# Patient Record
Sex: Female | Born: 1995 | Race: Black or African American | Hispanic: No | Marital: Single | Smoking: Never smoker
Health system: Southern US, Community
[De-identification: ages and names within clinical notes are randomized; demographics above are authoritative.]

## PROBLEM LIST (undated history)

## (undated) ENCOUNTER — Inpatient Hospital Stay (HOSPITAL_COMMUNITY): Payer: Self-pay

## (undated) DIAGNOSIS — T7840XA Allergy, unspecified, initial encounter: Secondary | ICD-10-CM

## (undated) DIAGNOSIS — Z8619 Personal history of other infectious and parasitic diseases: Secondary | ICD-10-CM

## (undated) DIAGNOSIS — A6009 Herpesviral infection of other urogenital tract: Secondary | ICD-10-CM

## (undated) DIAGNOSIS — A549 Gonococcal infection, unspecified: Secondary | ICD-10-CM

## (undated) DIAGNOSIS — A749 Chlamydial infection, unspecified: Secondary | ICD-10-CM

## (undated) HISTORY — DX: Gonococcal infection, unspecified: A54.9

## (undated) HISTORY — DX: Herpesviral infection of other urogenital tract: A60.09

## (undated) HISTORY — PX: TONSILLECTOMY: SUR1361

## (undated) HISTORY — DX: Allergy, unspecified, initial encounter: T78.40XA

## (undated) HISTORY — DX: Chlamydial infection, unspecified: A74.9

---
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
## 2013-11-06 ENCOUNTER — Emergency Department (HOSPITAL_COMMUNITY)
Admission: EM | Admit: 2013-11-06 | Discharge: 2013-11-06 | Disposition: A | Discharge status: Home / Self Care | Payer: BC Managed Care – PPO | Attending: Emergency Medicine | Admitting: Emergency Medicine

## 2013-11-06 ENCOUNTER — Encounter (HOSPITAL_COMMUNITY): Payer: Self-pay | Admitting: Emergency Medicine

## 2013-11-06 DIAGNOSIS — L02215 Cutaneous abscess of perineum: Secondary | ICD-10-CM

## 2013-11-06 DIAGNOSIS — L02219 Cutaneous abscess of trunk, unspecified: Secondary | ICD-10-CM | POA: Insufficient documentation

## 2013-11-06 DIAGNOSIS — O9989 Other specified diseases and conditions complicating pregnancy, childbirth and the puerperium: Secondary | ICD-10-CM | POA: Insufficient documentation

## 2013-11-06 DIAGNOSIS — Z8619 Personal history of other infectious and parasitic diseases: Secondary | ICD-10-CM | POA: Insufficient documentation

## 2013-11-06 MED ORDER — CLINDAMYCIN HCL 150 MG PO CAPS: 300.0000 mg | ORAL_CAPSULE | Freq: Three times a day (TID) | ORAL | Status: DC

## 2013-11-06 NOTE — ED Notes (Addendum)
Pt reports noticing a bump to L post thigh, and for past 3 days noticed it getting bigger and now is an abscess; tried hot compress at home; pt 6 months pregnant

## 2013-11-06 NOTE — ED Provider Notes (Signed)
CSN: [id number]     Arrival date & time 11/06/13  0027 History   None    Chief Complaint  Patient presents with  . Abscess    HPI  Raven Tucker is a 17 y.o. female with a PMH of allergies, chlamydia, herpes, and gonorrhea who presents to the ED for evaluation of an abscess.  History was provided by the patient.  Patient states she has had a developing lump to her left inner upper thigh for the past 3 days.  She states that the mass is growing larger and becoming more painful.  She tried applying warm compresses with no relief.  She has a hx of a cyst to her right arm but no abscesses in the past.  She denies any prior wounds/trauma to the area.  She denies any hx of DM.  She denies any fevers, chills, change in appetite/activity, abdominal pain, nausea, emesis, vaginal bleeding/discharge, dysuria, or other concerns.  She is currently 6 months pregnant with no complications.     Past Medical History  Diagnosis Date  . Allergy   . Chlamydia 2012; Dec 2013    x 2  . 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 Gonorrhea 11/12/2012   Past Surgical History  Procedure Laterality Date  . Tonsillectomy     Family History  Problem Relation Age of Onset  . Asthma Mother   . Heart disease Paternal Grandfather    History  Substance Use Topics  . Smoking status: Never Smoker   . Smokeless tobacco: Not on file  . Alcohol Use: No   OB History   Grav Para Term Preterm Abortions TAB SAB Ect Mult Living   1              Review of Systems  Constitutional: Negative for fever, chills, diaphoresis, appetite change and fatigue.  HENT: Negative for sore throat.   Eyes: Negative for visual disturbance.  Respiratory: Negative for cough and shortness of breath.   Cardiovascular: Negative for chest pain and leg swelling.  Gastrointestinal: Negative for nausea, vomiting, abdominal pain and diarrhea.  Genitourinary: Negative for dysuria, decreased urine volume, vaginal bleeding,  vaginal discharge, genital sores, vaginal pain and pelvic pain.  Musculoskeletal: Negative for back pain, gait problem, myalgias and neck pain.  Skin: Positive for color change and wound.  Neurological: Negative for weakness and headaches.    Allergies  Review of patient's allergies indicates no known allergies.  Home Medications  No current outpatient prescriptions on file. BP 116/74  Pulse 92  Temp(Src) 98.3 F (36.8 C) (Oral)  Resp 20  SpO2 100%  LMP 10/02/2012  Filed Vitals:   11/06/13 0035  BP: 116/74  Pulse: 92  Temp: 98.3 F (36.8 C)  TempSrc: Oral  Resp: 20  SpO2: 100%    Physical Exam  Nursing note and vitals reviewed. Constitutional: She is oriented to person, place, and time. She appears well-developed and well-nourished. No distress.  HENT:  Head: Normocephalic and atraumatic.  Right Ear: External ear normal.  Left Ear: External ear normal.  Nose: Nose normal.  Eyes: Conjunctivae are normal. Right eye exhibits no discharge. Left eye exhibits no discharge.  Neck: Normal range of motion. Neck supple.  Cardiovascular: Normal rate, regular rhythm, normal heart sounds and intact distal pulses.  Exam reveals no gallop and no friction rub.   No murmur heard. Pulmonary/Chest: Effort normal and breath sounds normal. No respiratory distress. She has no wheezes. She has no rales. She exhibits no  tenderness.  Abdominal: Soft. Bowel sounds are normal. She exhibits no distension and no mass. There is no tenderness. There is no rebound and no guarding.  Protuberant pregnant non-tender abdomen  Genitourinary:     Musculoskeletal: Normal range of motion. She exhibits no edema and no tenderness.  Patient able to ambulate without difficulty or ataxia  Neurological: She is alert and oriented to person, place, and time.  Skin: Skin is warm and dry. She is not diaphoretic.  5 cm x 4 cm fluctuant abscess to the left perineal region. No evidence of drainage. 6 cm x 6 cm  underlying area of induration. No overlying or surrounding erythema. Area is tender to palpation.     ED Course  INCISION AND DRAINAGE Date/Time: 11/06/2013 4:30 AM Performed by: Coral Ceo K Authorized by: Jillyn Ledger Consent: Verbal consent obtained. Risks and benefits: risks, benefits and alternatives were discussed Consent given by: patient Patient identity confirmed: verbally with patient Type: abscess Body area: anogenital Location details: perineum Anesthesia: local infiltration Local anesthetic: lidocaine 2% without epinephrine Anesthetic total: 4 ml Patient sedated: no Scalpel size: 11 Incision type: single straight Complexity: simple Drainage: purulent Drainage amount: moderate Wound treatment: wound left open Packing material: wick placed Patient tolerance: Patient tolerated the procedure well with no immediate complications.   (including critical care time) Labs Review Labs Reviewed - No data to display Imaging Review No results found.  EKG Interpretation   None       MDM   Raven Tucker is a 17 y.o. female with a PMH of allergies, chlamydia, herpes, and gonorrhea who presents to the ED for evaluation of an abscess.  Rechecks  4:20 AM =  Fetal heart tones 160    Patient seen in the emergency department for an abscess to the left perineal region, which was drained in the emergency department. Patient is afebrile and nontoxic in appearance. A wick was placed. Patient was placed on antibiotics clindamycin. Consulted pharmacy who states that clindamycin is safe to use in pregnancy. Patient was instructed to return to emergency Department in 2 days for wick removal and wound recheck. Patient was given return precautions.     Discharge Medication List as of 11/06/2013  4:35 AM    START taking these medications   Details  clindamycin (CLEOCIN) 150 MG capsule Take 2 capsules (300 mg total) by mouth 3 (three) times daily. May dispense  as 150mg  capsules, Starting 11/06/2013, Until Discontinued, Print         Final impressions: 1. Perineal abscess       Luiz Iron PA-C   This patient was discussed with Dr. Julio Sicks, PA-C 11/06/13 2115

## 2013-11-07 ENCOUNTER — Ambulatory Visit (INDEPENDENT_AMBULATORY_CARE_PROVIDER_SITE_OTHER): Payer: BC Managed Care – PPO | Admitting: Family Medicine

## 2013-11-07 VITALS — BP 102/58 | HR 82 | Temp 97.9°F | Resp 16 | Ht 61.5 in | Wt 175.0 lb

## 2013-11-07 DIAGNOSIS — L03319 Cellulitis of trunk, unspecified: Secondary | ICD-10-CM

## 2013-11-07 DIAGNOSIS — Z3403 Encounter for supervision of normal first pregnancy, third trimester: Secondary | ICD-10-CM

## 2013-11-07 DIAGNOSIS — L02214 Cutaneous abscess of groin: Secondary | ICD-10-CM

## 2013-11-07 DIAGNOSIS — R52 Pain, unspecified: Secondary | ICD-10-CM

## 2013-11-07 DIAGNOSIS — L02219 Cutaneous abscess of trunk, unspecified: Secondary | ICD-10-CM

## 2013-11-07 NOTE — ED Provider Notes (Signed)
Medical screening examination/treatment/procedure(s) were performed by non-physician practitioner and as supervising physician I was immediately available for consultation/collaboration.    Vida Roller, MD 11/07/13 [phone]

## 2013-11-07 NOTE — Progress Notes (Signed)
Chief Complaint:  Chief Complaint  Patient presents with  . Wound Check    HPI: Raven Tucker is a 17 y.o. female who is here for a follow up on abscess from 2 days ago on left inner thigh. It is slowly getting better , she is able to walk better when it is covered up. Patient was seen at [facility] emergency room on 11/06/2013 where they drained the abscess and discharged her on Clindamycin. She is afebrile. From prior notes it does not appear that she had a wound culture done. Mom states she thought one was done.  She started her abx today at noon, so has only had 2 doses of the clindamycin. She is 6 months pregnant.  She has pain with sitting, and she feels leakage from the wound.   Past Medical History  Diagnosis Date  . Allergy   . Chlamydia 2012; Dec 2013    x 2  . 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 Gonorrhea 11/12/2012   Past Surgical History  Procedure Laterality Date  . Tonsillectomy     History   Social History  . Marital Status: Single    Spouse Name: N/A    Number of Children: N/A  . Years of Education: N/A   Social History Main Topics  . Smoking status: Never Smoker   . Smokeless tobacco: Not on file  . Alcohol Use: No  . Drug Use: No  . Sexual Activity: Not on file   Other Topics Concern  . Not on file   Social History Narrative  . No narrative on file   Family History  Problem Relation Age of Onset  . Asthma Mother   . Heart disease Paternal Grandfather    No Known Allergies Prior to Admission medications   Medication Sig Start Date End Date Taking? Authorizing Provider  clindamycin (CLEOCIN) 150 MG capsule Take 2 capsules (300 mg total) by mouth 3 (three) times daily. May dispense as 150mg  capsules 11/06/13  Yes Jillyn Ledger, PA-C     ROS: The patient denies fevers, chills, night sweats, unintentional weight loss, chest pain, palpitations, wheezing, dyspnea on exertion, nausea, vomiting, abdominal pain, dysuria,  hematuria, melena, numbness, weakness, or tingling.   All other systems have been reviewed and were otherwise negative with the exception of those mentioned in the HPI and as above.    PHYSICAL EXAM: Filed Vitals:   11/07/13 1947  BP: 102/58  Pulse: 82  Temp: 97.9 F (36.6 C)  Resp: 16   Filed Vitals:   11/07/13 1947  Height: 5' 1.5" (1.562 m)  Weight: 175 lb (79.379 kg)   Body mass index is 32.53 kg/(m^2).  General: Alert, no acute distress HEENT:  Normocephalic, atraumatic, oropharynx patent. EOMI, PERRLA Cardiovascular:  Regular rate and rhythm, no rubs murmurs or gallops.  No Carotid bruits, radial pulse intact. No pedal edema.  Respiratory: Clear to auscultation bilaterally.  No wheezes, rales, or rhonchi.  No cyanosis, no use of accessory musculature GI: No organomegaly, abdomen is gravid and non-tender, positive bowel sounds. FHM 140-150 Skin: + perineal left abscess, serosainguionous drainage, 3 incision sites , 1 closed, and 2 are closing, there is more fluctuance on the lower pole of abscess, erythematous, tender, warm Neurologic: Facial musculature symmetric. Psychiatric: Patient is appropriate throughout our interaction. Lymphatic: No cervical lymphadenopathy Musculoskeletal: Gait intact.   LABS: Results for orders placed in visit on 11/08/12  HCG, QUANTITATIVE, PREGNANCY      Result  Value Range   hCG, Beta Chain, Quant, S <2.0       EKG/XRAY:   Primary read interpreted by Dr. Conley Rolls at [facility].   ASSESSMENT/PLAN: Encounter Diagnosis  Name Primary?  Marland Kitchen Abscess of superficial perineal space Yes   17 y/o [place] American female G1P0 who is at [redacted] weeks gestation who is here with a superficial abscess of the left inner thigh: Had to do another reincision since prior I&D were closing and still had fluctuance underneath Cleaned periwound with normal saline, Packed  Packing strips after I and D Continue with Cleocin   + skin erythema , warmth, tenderness of wound  noted. Risks (including but not limited to bleeding and infection), benefits, and alternatives discussed for left inner thigh abscess incision and aspiration. Verbal consent obtained after any questions were answered., and verbal understanding expressed. Landmarks noted, and marked as needed. Area cleansed with Betadine, ethyl chloride spray for topical anesthesia, followed by alcohol swab. 25 gauge needle used for 2 % lidocaine without epi, once anesthesia of area obtained 11 inch blade was used to make incision slightly bigger than previous so that pus could drain more freely. Minimal blood nad  pus were released from larger incision. EBL less than 0.5 ml. Paitent tolerated procedure well. Wound culture was obtained since she had really just started on her abx FHM was in 140-150, Refer to Ob Gyn for pregnancy, encourage prenatal vitamins, she just found out she was pregnant , has not had OB US F/u in 2 days with  Mrs Rhoderick Moody PA-C for wound checl Gross sideeffects, risk and benefits, and alternatives of medications d/w patient. Patient is aware that all medications have potential sideeffects and we are unable to predict every sideeffect or drug-drug interaction that may occur.  Hamilton Capri PHUONG, DO 11/07/2013 9:25 PM

## 2013-11-10 ENCOUNTER — Encounter (HOSPITAL_COMMUNITY): Payer: Self-pay

## 2013-11-10 ENCOUNTER — Inpatient Hospital Stay (HOSPITAL_COMMUNITY)
Admission: AD | Admit: 2013-11-10 | Discharge: 2013-11-10 | Disposition: A | Discharge status: Home / Self Care | Payer: BC Managed Care – PPO | Source: Ambulatory Visit | Attending: Obstetrics and Gynecology | Admitting: Obstetrics and Gynecology

## 2013-11-10 ENCOUNTER — Ambulatory Visit (INDEPENDENT_AMBULATORY_CARE_PROVIDER_SITE_OTHER): Payer: BC Managed Care – PPO | Admitting: Family Medicine

## 2013-11-10 VITALS — BP 110/64 | HR 80 | Temp 97.9°F | Resp 16 | Ht 61.5 in | Wt 177.2 lb

## 2013-11-10 DIAGNOSIS — Z113 Encounter for screening for infections with a predominantly sexual mode of transmission: Secondary | ICD-10-CM

## 2013-11-10 DIAGNOSIS — L02419 Cutaneous abscess of limb, unspecified: Secondary | ICD-10-CM | POA: Insufficient documentation

## 2013-11-10 DIAGNOSIS — O0930 Supervision of pregnancy with insufficient antenatal care, unspecified trimester: Secondary | ICD-10-CM | POA: Insufficient documentation

## 2013-11-10 DIAGNOSIS — O26859 Spotting complicating pregnancy, unspecified trimester: Secondary | ICD-10-CM

## 2013-11-10 DIAGNOSIS — O98819 Other maternal infectious and parasitic diseases complicating pregnancy, unspecified trimester: Secondary | ICD-10-CM | POA: Insufficient documentation

## 2013-11-10 DIAGNOSIS — L02219 Cutaneous abscess of trunk, unspecified: Secondary | ICD-10-CM

## 2013-11-10 DIAGNOSIS — Z3403 Encounter for supervision of normal first pregnancy, third trimester: Secondary | ICD-10-CM

## 2013-11-10 DIAGNOSIS — A5901 Trichomonal vulvovaginitis: Secondary | ICD-10-CM | POA: Insufficient documentation

## 2013-11-10 DIAGNOSIS — N898 Other specified noninflammatory disorders of vagina: Secondary | ICD-10-CM | POA: Insufficient documentation

## 2013-11-10 DIAGNOSIS — A599 Trichomoniasis, unspecified: Secondary | ICD-10-CM

## 2013-11-10 DIAGNOSIS — L02214 Cutaneous abscess of groin: Secondary | ICD-10-CM

## 2013-11-10 LAB — URINALYSIS, ROUTINE W REFLEX MICROSCOPIC
Bilirubin Urine: NEGATIVE
Glucose, UA: NEGATIVE mg/dL
Ketones, ur: 15 mg/dL — AB
pH: 7.5 (ref 5.0–8.0)

## 2013-11-10 LAB — POCT WET PREP WITH KOH
Clue Cells Wet Prep HPF POC: NEGATIVE
KOH Prep POC: NEGATIVE
RBC Wet Prep HPF POC: NEGATIVE
Trichomonas, UA: POSITIVE
Yeast Wet Prep HPF POC: NEGATIVE

## 2013-11-10 LAB — URINE MICROSCOPIC-ADD ON

## 2013-11-10 LAB — WOUND CULTURE
Gram Stain: NONE SEEN
Gram Stain: NONE SEEN
Gram Stain: NONE SEEN

## 2013-11-10 LAB — ABO/RH: ABO/RH(D): B POS

## 2013-11-10 MED ORDER — METRONIDAZOLE 500 MG PO TABS
2000.0000 mg | ORAL_TABLET | Freq: Once | ORAL | Status: AC
  Administered 2013-11-10: 2000 mg via ORAL
  Filled 2013-11-10: qty 4

## 2013-11-10 NOTE — MAU Note (Signed)
Dr. Ike Bene at bedside with pt and her mother discussing discharge plans.

## 2013-11-10 NOTE — MAU Note (Signed)
Patient states she has had no prenatal care. Has had spotting on and off for the past two days, none today. States she had an abscess on the left inner thigh drained on 12-10 at [facility] ED and is on antibiotics. Denies abdominal pain. Reports good fetal movement.

## 2013-11-10 NOTE — MAU Provider Note (Signed)
`````  Attestation of Attending Supervision of Advanced Practitioner: Evaluation and management procedures were performed by the PA/NP/CNM/OB Fellow under my supervision/collaboration. Chart reviewed and agree with management and plan.  Yuan Gann V 11/10/2013 6:11 PM

## 2013-11-10 NOTE — Progress Notes (Signed)
   Subjective:    Patient ID: Raven Tucker, female    DOB: 11-30-1995, 17 y.o.   MRN: [id number]  Wound Check   17 year old female presents today for wound check. Had left groin abscess drained in ED on 11/06/13 and then followed up here on 11/07/13.  Incision was lengthened and more purulence expressed. Also was packed and bandaged with follow up planned today.  She admits wound does seem to be doing much better and less painful.  Has been on Clindamycin which she had been tolerating well until last night she noticed some mucous in her stool.    Patient is 6 months pregnant - found out 2 weeks ago. She has not had any prenatal care. Presents today with her mother who states they were planning to call tomorrow and make an appointment. Also concerned about a small amount of spotting she noticed last night. Denies any further bleeding today. Denies any abdominal pain, fever, chills, nausea, or vomiting.  Also has noticed a small amount of vaginal discharge.  No pruritis or odor.      Review of Systems  Constitutional: Negative for fever and chills.  Gastrointestinal: Negative for nausea and vomiting.  Genitourinary: Positive for vaginal bleeding and vaginal discharge. Negative for dysuria and vaginal pain.  Skin: Positive for wound.  Neurological: Negative for headaches.       Objective:   Physical Exam  Constitutional: She is oriented to person, place, and time. She appears well-developed and well-nourished.  HENT:  Head: Normocephalic and atraumatic.  Right Ear: External ear normal.  Left Ear: External ear normal.  Eyes: Conjunctivae are normal.  Neck: Normal range of motion.  Cardiovascular: Normal rate.   Pulmonary/Chest: Effort normal.  Genitourinary:    There is no rash on the right labia. There is no rash on the left labia. Vaginal discharge found.  Neurological: She is alert and oriented to person, place, and time.  Psychiatric: She has a normal mood and affect. Her  behavior is normal. Judgment and thought content normal.   Procedure: Dressing removed. Packing not in place Irrigated with 2% plain lidocaine.  Wound bed healthy No purulence expressed.  Repacked with 1/4 plain packing.  Dressing applied.    Results for orders placed in visit on 11/10/13  POCT WET PREP WITH KOH      Result Value Range   Trichomonas, UA Positive     Clue Cells Wet Prep HPF POC neg     Epithelial Wet Prep HPF POC 4-10     Yeast Wet Prep HPF POC neg     Bacteria Wet Prep HPF POC 2+     RBC Wet Prep HPF POC neg     WBC Wet Prep HPF POC 6-15     KOH Prep POC Negative         Assessment & Plan:  Trichomonas  Abscess, groin  Leukorrhea, not specified as infective - Plan: POCT Wet Prep with KOH  Screening for venereal disease - Plan: GC/Chlamydia Probe Amp, CANCELED: HIV antibody, CANCELED: RPR Patient is 6 months pregnant and has had no prenatal care up to this point. Now has trichomonas infection with spotting. Will send to [facility] for f/u. HIV/RPR cancelled here. Genprobe pending Abscess of left groin well healing.  Recommend continued dressing changes. Will let OB decide if continued therapy with clindamycin is needed.  Follow up here as needed.

## 2013-11-10 NOTE — Progress Notes (Signed)
History and physical examinations reviewed in detail with Rhoderick Moody, PA-C.  Agree with A/P.

## 2013-11-10 NOTE — MAU Provider Note (Signed)
History     CSN: [id number]  Arrival date and time: 11/10/13 1332   First Provider Initiated Contact with Patient 11/10/13 1428      Chief Complaint  Patient presents with  . Vaginal Discharge   HPI Raven Tucker is a 17 y.o. G1P0 at [redacted]w[redacted]d presents from Urgent care with multiple complaints.  1) pt reports vaginal spotting over the last 3 days. Pt had 2 episode with wiping after urination. Red, Minimal, not in the toilet. First was 2 days ago, once yesterday and none today. Pt states never has had any on her underwear. Never has had any in the toilet.   2) Vaginal discharge - pt was diagnosed with trich at Urgent care. Pt reports that she has been having vaginal discharge for several months.  3) Lack of prenatal care: pt is in need of establishing care. Pt is in [place] for the holiday, but is planning on returning to school in the spring for the remainder of her pregnancy. Pt needs to establish care in [place] and I will provide a list of providers in the area both here and in [place].   4) wound packing. Pt has an abscess that was drained 2 days ago. Pt presented to urgent care today for dressing change and was repacked. Pt currently on clindamycin for coverage. This is a category B medication.   Pt reports +FM, no lof, no additional VB, and no ctx.  OB History   Grav Para Term Preterm Abortions TAB SAB Ect Mult Living   1               Past Medical History  Diagnosis Date  . Allergy   . Chlamydia 2012; Dec 2013    x 2  . 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 Gonorrhea 11/12/2012    Past Surgical History  Procedure Laterality Date  . Tonsillectomy      Family History  Problem Relation Age of Onset  . Asthma Mother   . Heart disease Paternal Grandfather     History  Substance Use Topics  . Smoking status: Never Smoker   . Smokeless tobacco: Never Used  . Alcohol Use: No    Allergies: No Known Allergies  Prescriptions prior to admission   Medication Sig Dispense Refill  . clindamycin (CLEOCIN) 150 MG capsule Take 2 capsules (300 mg total) by mouth 3 (three) times daily. May dispense as 150mg  capsules  60 capsule  0    ROS Physical Exam   Blood pressure 110/64, pulse 76, temperature 97.9 F (36.6 C), temperature source Oral, resp. rate 16, height 5' (1.524 m), weight 80.377 kg (177 lb 3.2 oz), last menstrual period 04/01/2013, SpO2 100.00%.  Physical Exam VSS NAD Gravid NTTP No c/c/e SSE: inflamed cervix, frothy discharge. Appears closed.   Left leg is dressed freshly, repacked and was not reexamined today.  FHT:150s mod var, no accels or decels Toco: mild uterine irritability. Pt not feeling.  MAU Course  Procedures  MDM Gc/C, provide contacts for [place].  Assessment and Plan  Raven Tucker is a 17 y.o. G1P0 at [redacted]w[redacted]d with multiple complaints.  1) Vaginal spotting/Trich: likely related to cervical irrtation from trich. Minimal without any blood in vault. Will treat trich with flagyl 2000mg  x1 now, and recommend reevaluation if no resolution in next week. Will draw ABO to check Rh status.  2) Lack of prenatal care: pt is in need of establishing care. Pt is in [place] for the holiday, but is planning  on returning to school in the spring for the remainder of her pregnancy. Pt needs to establish care in [place] and I will provide a list of providers in the area both here and in [place].   4) wound packing. Pt has an abscess that was drained 2 days ago. Pt presented to urgent care today for dressing change and was repacked. Pt currently on clindamycin for coverage. This is a category B medication. Recommend pt continue abx coverage and to follow up with a PCM for continued wound management. Previously seen by [facility] when here for pediatrics.   FWB: no accels or decels. Overall reassuring fetal status. No acute changes at this time. PTL precautions reviewed.   Raven Tucker 11/10/2013, 2:51 PM

## 2013-11-11 ENCOUNTER — Encounter: Payer: Self-pay | Admitting: Obstetrics & Gynecology

## 2013-11-11 ENCOUNTER — Telehealth: Payer: Self-pay

## 2013-11-11 LAB — URINE CULTURE: Colony Count: 15000

## 2013-11-11 LAB — GC/CHLAMYDIA PROBE AMP
CT Probe RNA: POSITIVE — AB
GC Probe RNA: POSITIVE — AB

## 2013-11-11 NOTE — Telephone Encounter (Signed)
Pt. Called front stating she has GC/chlamydia and was told to call here and be treated and also schedule a new OB visit. Pt. States she can come in 11/13/13 at 830 am for treatment. Informed pt. I would schedule that with the front desk and that we will see her Wednesday.  Advised pt. To have partner treated and to abstain from sex until both are treated. Pt. Verbalized understanding and had no other questions or concerns.

## 2013-11-13 ENCOUNTER — Ambulatory Visit (INDEPENDENT_AMBULATORY_CARE_PROVIDER_SITE_OTHER): Payer: BC Managed Care – PPO

## 2013-11-13 ENCOUNTER — Telehealth: Payer: Self-pay | Admitting: Radiology

## 2013-11-13 VITALS — BP 132/80 | HR 77 | Wt 177.6 lb

## 2013-11-13 DIAGNOSIS — A749 Chlamydial infection, unspecified: Secondary | ICD-10-CM

## 2013-11-13 DIAGNOSIS — A5400 Gonococcal infection of lower genitourinary tract, unspecified: Secondary | ICD-10-CM

## 2013-11-13 DIAGNOSIS — A549 Gonococcal infection, unspecified: Secondary | ICD-10-CM

## 2013-11-13 MED ORDER — CEFTRIAXONE SODIUM 1 G IJ SOLR
250.0000 mg | Freq: Once | INTRAMUSCULAR | Status: AC
  Administered 2013-11-13: 250 mg via INTRAMUSCULAR

## 2013-11-13 MED ORDER — AZITHROMYCIN 250 MG PO TABS
1000.0000 mg | ORAL_TABLET | Freq: Once | ORAL | Status: AC
  Administered 2013-11-13: 1000 mg via ORAL

## 2013-11-13 NOTE — Telephone Encounter (Signed)
Error

## 2013-11-13 NOTE — Telephone Encounter (Signed)
Called patient for Raven Tucker, to see when she plans to return for her wound care. Patient does plan to come in tomorrow.

## 2013-11-14 ENCOUNTER — Ambulatory Visit (INDEPENDENT_AMBULATORY_CARE_PROVIDER_SITE_OTHER): Payer: BC Managed Care – PPO | Admitting: Physician Assistant

## 2013-11-14 VITALS — BP 110/66 | HR 81 | Temp 97.6°F | Resp 16 | Ht 61.0 in | Wt 176.6 lb

## 2013-11-14 DIAGNOSIS — L02214 Cutaneous abscess of groin: Secondary | ICD-10-CM

## 2013-11-14 DIAGNOSIS — Z113 Encounter for screening for infections with a predominantly sexual mode of transmission: Secondary | ICD-10-CM

## 2013-11-14 DIAGNOSIS — O0933 Supervision of pregnancy with insufficient antenatal care, third trimester: Secondary | ICD-10-CM

## 2013-11-14 DIAGNOSIS — Z3402 Encounter for supervision of normal first pregnancy, second trimester: Secondary | ICD-10-CM

## 2013-11-14 LAB — HIV ANTIBODY (ROUTINE TESTING W REFLEX): HIV: NONREACTIVE

## 2013-11-14 LAB — RPR

## 2013-11-14 NOTE — Progress Notes (Deleted)
   Subjective:    Patient ID: Raven Tucker, female    DOB: 1996/04/25, 17 y.o.   MRN: [id number]  HPI 17 year old female presents for evaluation of     Review of Systems     Objective:   Physical Exam        Assessment & Plan:

## 2013-11-14 NOTE — Progress Notes (Signed)
Patient ID: Raven Tucker MRN: [id number], DOB: May 08, 1996 17 y.o. Date of Encounter: 11/14/2013, 8:58 PM  Chief Complaint: Wound care   See previous note  HPI: 17 y.o. y/o female presents for wound care s/p I&D on 11/06/13.  Doing well No issues or complaints Afebrile/ no chills No nausea or vomiting Tolerating Clindamycin.   Pain resolved.  Daily dressing change  Also discussed visit to [facility]. Will draw HIV/RPR today. Plan to f/u on 11/26/13 for 1st OB visit. She does plan to return to [facility] the first week of January to finish school and will try to seek OB care there.   Previous notes reviewed  Past Medical History  Diagnosis Date  . Allergy   . Chlamydia 2012; Dec 2013    x 2  . 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 Gonorrhea 11/12/2012     Home Meds: Prior to Admission medications   Medication Sig Start Date End Date Taking? Authorizing Provider  clindamycin (CLEOCIN) 150 MG capsule Take 2 capsules (300 mg total) by mouth 3 (three) times daily. May dispense as 150mg  capsules 11/06/13  Yes Jillyn Ledger, PA-C  Prenatal Vit-Fe Fumarate-FA (PRENATAL MULTIVITAMIN) TABS tablet Take 1 tablet by mouth daily at 12 noon.   Yes Historical Provider, MD    Allergies: No Known Allergies  ROS: Constitutional: Afebrile, no chills Cardiovascular: negative for chest pain or palpitations Dermatological: Positive for wound. Negative for erythema, pain, or warmth.   GI: No nausea or vomiting   EXAM: Physical Exam: Blood pressure 110/66, pulse 81, temperature 97.6 F (36.4 C), temperature source Oral, resp. rate 16, height 5\' 1"  (1.549 m), weight 176 lb 9.6 oz (80.105 kg), last menstrual period 04/01/2013, SpO2 100.00%., Body mass index is 33.39 kg/(m^2). General: Well developed, well nourished, in no acute distress. Nontoxic appearing. Head: Normocephalic, atraumatic, sclera non-icteric.  Neck: Supple. Lungs: Breathing is unlabored. Heart: Normal  rate. Skin:  Warm and moist. Dressing and packing in place. No induration, erythema, or tenderness to palpation. Neuro: Alert and oriented X 3. Moves all extremities spontaneously. Normal gait.  Psych:  Responds to questions appropriately with a normal affect.       PROCEDURE: Dressing and packing removed. No purulence expressed Wound bed healthy Irrigated with 1% plain lidocaine 5 cc. Not repacked.  Dressing applied  LAB: Culture: multiple organisms, none predominant.   A/P: 17 y.o. y/o female with groin cellulitis/abscess as above s/p I&D on 11/06/13.  Wound care per above Continue Clindamycin.  Pain well controlled Daily dressing changes Recheck as needed.   Dr. Jaynie Collins remotely placed order for [place] in this encounter.     Grier Mitts, PA-C 11/14/2013 8:58 PM

## 2013-11-14 NOTE — Telephone Encounter (Signed)
Patient came in today. Herbert Seta has discussed patient with the clinic at [facility] and patient will be contacted with an appointment for an [place] soon.

## 2013-11-15 ENCOUNTER — Telehealth: Payer: Self-pay | Admitting: Radiology

## 2013-11-15 NOTE — Telephone Encounter (Signed)
Patient calling, wants to know when her [place] will be scheduled.

## 2013-11-18 NOTE — Telephone Encounter (Signed)
Ultrasound ordered for patient on 11/22/13 at 11 am.  Patient called and told about ultrasound date and time.  She was also reminded of her new OB appointment on 11/26/13 at 1:45pm.

## 2013-11-22 ENCOUNTER — Ambulatory Visit (HOSPITAL_COMMUNITY): Admission: RE | Admit: 2013-11-22 | Payer: BC Managed Care – PPO | Source: Ambulatory Visit

## 2013-11-26 ENCOUNTER — Ambulatory Visit (INDEPENDENT_AMBULATORY_CARE_PROVIDER_SITE_OTHER): Payer: BC Managed Care – PPO | Admitting: Family Medicine

## 2013-11-26 ENCOUNTER — Encounter: Payer: Self-pay | Admitting: Family Medicine

## 2013-11-26 VITALS — BP 114/82 | Wt 178.3 lb

## 2013-11-26 DIAGNOSIS — O0930 Supervision of pregnancy with insufficient antenatal care, unspecified trimester: Secondary | ICD-10-CM

## 2013-11-26 DIAGNOSIS — O0933 Supervision of pregnancy with insufficient antenatal care, third trimester: Secondary | ICD-10-CM

## 2013-11-26 DIAGNOSIS — Z23 Encounter for immunization: Secondary | ICD-10-CM

## 2013-11-26 DIAGNOSIS — A568 Sexually transmitted chlamydial infection of other sites: Secondary | ICD-10-CM

## 2013-11-26 DIAGNOSIS — A5901 Trichomonal vulvovaginitis: Secondary | ICD-10-CM

## 2013-11-26 DIAGNOSIS — O2390 Unspecified genitourinary tract infection in pregnancy, unspecified trimester: Secondary | ICD-10-CM

## 2013-11-26 DIAGNOSIS — O98213 Gonorrhea complicating pregnancy, third trimester: Secondary | ICD-10-CM

## 2013-11-26 DIAGNOSIS — O98319 Other infections with a predominantly sexual mode of transmission complicating pregnancy, unspecified trimester: Secondary | ICD-10-CM

## 2013-11-26 DIAGNOSIS — O98219 Gonorrhea complicating pregnancy, unspecified trimester: Secondary | ICD-10-CM

## 2013-11-26 DIAGNOSIS — A749 Chlamydial infection, unspecified: Secondary | ICD-10-CM

## 2013-11-26 LAB — POCT URINALYSIS DIP (DEVICE)
Bilirubin Urine: NEGATIVE
Ketones, ur: NEGATIVE mg/dL
Protein, ur: NEGATIVE mg/dL
Specific Gravity, Urine: 1.015 (ref 1.005–1.030)
Urobilinogen, UA: 0.2 mg/dL (ref 0.0–1.0)
pH: 7.5 (ref 5.0–8.0)

## 2013-11-26 LAB — CBC
MCV: 84 fL (ref 78.0–100.0)
Platelets: 182 10*3/uL (ref 150–400)
RBC: 4.37 MIL/uL (ref 3.87–5.11)
RDW: 12.8 % (ref 11.5–15.5)
WBC: 12.1 10*3/uL — ABNORMAL HIGH (ref 4.0–10.5)

## 2013-11-26 MED ORDER — TETANUS-DIPHTH-ACELL PERTUSSIS 5-2.5-18.5 LF-MCG/0.5 IM SUSP: 0.5000 mL | Freq: Once | INTRAMUSCULAR | Status: DC

## 2013-11-26 NOTE — Progress Notes (Signed)
  Subjective:    Raven Tucker is being seen today for her first obstetrical visit.  This is not a planned pregnancy. She is at [redacted]w[redacted]d gestation. Her obstetrical history is significant for multiple STI's (trich, gc/c). Relationship with FOB: seperated. Patient does not intend to breast feed. Pregnancy history fully reviewed.  Patient reports fatigue.  Review of Systems:   Review of Systems  Objective:     BP 114/82  Wt 80.876 kg (178 lb 4.8 oz)  LMP 04/01/2013 Physical Exam  Exam VSS NAD RRR no mgt CTAB no wrc Gravid NTTP, ND No c/c/e   Assessment:    Pregnancy: G1P0 Patient Active Problem List   Diagnosis Date Noted  . Insufficient prenatal care in third trimester 11/26/2013  . Gonorrhea 11/12/2012  . Chlamydia 11/12/2012  . Herpes simplex of female genitalia 11/12/2012       Plan:    +FM no lof no vb, no ctx  Raven Tucker is a 17 y.o. G1P0 at [redacted]w[redacted]d  here for ROB visit.  TOC for GC/C and trich today  Discussed with Patient:  -Plans to bottle feed.  All questions answered. -Continue prenatal vitamins. - Reviewed fetal kick counts (Pt to perform daily at a time when the baby is active, lie laterally with both hands on belly in quiet room and count all movements (hiccups, shoulder rolls, obvious kicks, etc); pt is to report to clinic or MAU for less than 10 movements felt in a one hour time period-pt told as soon as she counts 10 movements the count is complete.)  - Routine precautions discussed (depression, infection s/s).   Patient provided with all pertinent phone numbers for emergencies. - RTC for any VB, regular, painful cramps/ctxs occurring at a rate of >2/10 min, fever (100.5 or higher), n/v/d, any pain that is unresolving or worsening, LOF, decreased fetal movement, CP, SOB, edema  F/u appt 2 weeks  Problems: Patient Active Problem List   Diagnosis Date Noted  . Gonorrhea 11/12/2012  . Chlamydia 11/12/2012  . Herpes simplex of female genitalia  11/12/2012    To Do: 1.  Prenatal labs 2.  glucola 3. TOC gc/c wet mount  [ ]  Vaccines: Flu:  Tdap: 11/26/13 [ ]  BCM: undecided  Edu: [x ] PTL precautions; [ ]  BF class; [ ]  childbirth class; [ ]   BF counseling;

## 2013-11-26 NOTE — Patient Instructions (Signed)
Second Trimester of Pregnancy The second trimester is from week 13 through week 28, months 4 through 6. The second trimester is often a time when you feel your best. Your body has also adjusted to being pregnant, and you begin to feel better physically. Usually, morning sickness has lessened or quit completely, you may have more energy, and you may have an increase in appetite. The second trimester is also a time when the fetus is growing rapidly. At the end of the sixth month, the fetus is about 9 inches long and weighs about 1 pounds. You will likely begin to feel the baby move (quickening) between 18 and 20 weeks of the pregnancy. BODY CHANGES Your body goes through many changes during pregnancy. The changes vary from woman to woman.   Your weight will continue to increase. You will notice your lower abdomen bulging out.  You may begin to get stretch marks on your hips, abdomen, and breasts.  You may develop headaches that can be relieved by medicines approved by your caregiver.  You may urinate more often because the fetus is pressing on your bladder.  You may develop or continue to have heartburn as a result of your pregnancy.  You may develop constipation because certain hormones are causing the muscles that push waste through your intestines to slow down.  You may develop hemorrhoids or swollen, bulging veins (varicose veins).  You may have back pain because of the weight gain and pregnancy hormones relaxing your joints between the bones in your pelvis and as a result of a shift in weight and the muscles that support your balance.  Your breasts will continue to grow and be tender.  Your gums may bleed and may be sensitive to brushing and flossing.  Dark spots or blotches (chloasma, mask of pregnancy) may develop on your face. This will likely fade after the baby is born.  A dark line from your belly button to the pubic area (linea nigra) may appear. This will likely fade after the  baby is born. WHAT TO EXPECT AT YOUR PRENATAL VISITS During a routine prenatal visit:  You will be weighed to make sure you and the fetus are growing normally.  Your blood pressure will be taken.  Your abdomen will be measured to track your baby's growth.  The fetal heartbeat will be listened to.  Any test results from the previous visit will be discussed. Your caregiver may ask you:  How you are feeling.  If you are feeling the baby move.  If you have had any abnormal symptoms, such as leaking fluid, bleeding, severe headaches, or abdominal cramping.  If you have any questions. Other tests that may be performed during your second trimester include:  Blood tests that check for:  Low iron levels (anemia).  Gestational diabetes (between 24 and 28 weeks).  Rh antibodies.  Urine tests to check for infections, diabetes, or protein in the urine.  An ultrasound to confirm the proper growth and development of the baby.  An amniocentesis to check for possible genetic problems.  Fetal screens for spina bifida and Down syndrome. HOME CARE INSTRUCTIONS   Avoid all smoking, herbs, alcohol, and unprescribed drugs. These chemicals affect the formation and growth of the baby.  Follow your caregiver's instructions regarding medicine use. There are medicines that are either safe or unsafe to take during pregnancy.  Exercise only as directed by your caregiver. Experiencing uterine cramps is a good sign to stop exercising.  Continue to eat regular,   healthy meals.  Wear a good support bra for breast tenderness.  Do not use hot tubs, steam rooms, or saunas.  Wear your seat belt at all times when driving.  Avoid raw meat, uncooked cheese, cat litter boxes, and soil used by cats. These carry germs that can cause birth defects in the baby.  Take your prenatal vitamins.  Try taking a stool softener (if your caregiver approves) if you develop constipation. Eat more high-fiber foods,  such as fresh vegetables or fruit and whole grains. Drink plenty of fluids to keep your urine clear or pale yellow.  Take warm sitz baths to soothe any pain or discomfort caused by hemorrhoids. Use hemorrhoid cream if your caregiver approves.  If you develop varicose veins, wear support hose. Elevate your feet for 15 minutes, 3 4 times a day. Limit salt in your diet.  Avoid heavy lifting, wear low heel shoes, and practice good posture.  Rest with your legs elevated if you have leg cramps or low back pain.  Visit your dentist if you have not gone yet during your pregnancy. Use a soft toothbrush to brush your teeth and be gentle when you floss.  A sexual relationship may be continued unless your caregiver directs you otherwise.  Continue to go to all your prenatal visits as directed by your caregiver. SEEK MEDICAL CARE IF:   You have dizziness.  You have mild pelvic cramps, pelvic pressure, or nagging pain in the abdominal area.  You have persistent nausea, vomiting, or diarrhea.  You have a bad smelling vaginal discharge.  You have pain with urination. SEEK IMMEDIATE MEDICAL CARE IF:   You have a fever.  You are leaking fluid from your vagina.  You have spotting or bleeding from your vagina.  You have severe abdominal cramping or pain.  You have rapid weight gain or loss.  You have shortness of breath with chest pain.  You notice sudden or extreme swelling of your face, hands, ankles, feet, or legs.  You have not felt your baby move in over an hour.  You have severe headaches that do not go away with medicine.  You have vision changes. Document Released: 11/08/2001 Document Revised: 07/17/2013 Document Reviewed: 01/15/2013 [employer] Patient Information 2014 [employer].  

## 2013-11-26 NOTE — Progress Notes (Signed)
P= 81 Discussed appropriate weight gain based on BMI for this pregnancy (11-20lb). Pt. Verbalized understanding.  NEW OB packet given.  Tdap today  1hr gtt today.

## 2013-11-27 ENCOUNTER — Telehealth: Payer: Self-pay | Admitting: Family Medicine

## 2013-11-27 LAB — PRESCRIPTION MONITORING PROFILE (19 PANEL)
Amphetamine/Meth: NEGATIVE ng/mL
Barbiturate Screen, Urine: NEGATIVE ng/mL
Benzodiazepine Screen, Urine: NEGATIVE ng/mL
Buprenorphine, Urine: NEGATIVE ng/mL
Cannabinoid Scrn, Ur: NEGATIVE ng/mL
Carisoprodol, Urine: NEGATIVE ng/mL
Meperidine, Ur: NEGATIVE ng/mL
Methadone Screen, Urine: NEGATIVE ng/mL
Methaqualone: NEGATIVE ng/mL
Nitrites, Initial: NEGATIVE ug/mL
Opiate Screen, Urine: NEGATIVE ng/mL
Phencyclidine, Ur: NEGATIVE ng/mL
Propoxyphene: NEGATIVE ng/mL
Tapentadol, urine: NEGATIVE ng/mL

## 2013-11-27 LAB — GLUCOSE TOLERANCE, 1 HOUR (50G) W/O FASTING: Glucose, 1 Hour GTT: 72 mg/dL (ref 70–140)

## 2013-11-27 LAB — WET PREP, GENITAL

## 2013-11-27 LAB — RUBELLA SCREEN: Rubella: 3.07 Index — ABNORMAL HIGH (ref <0.90)

## 2013-11-27 LAB — HEPATITIS B SURFACE ANTIGEN: Hepatitis B Surface Ag: NEGATIVE

## 2013-11-27 LAB — GC/CHLAMYDIA PROBE AMP
CT Probe RNA: NEGATIVE
GC Probe RNA: NEGATIVE

## 2013-11-27 MED ORDER — FLUCONAZOLE 150 MG PO TABS: 150.0000 mg | ORAL_TABLET | Freq: Once | ORAL | Status: DC

## 2013-11-27 MED ORDER — METRONIDAZOLE 500 MG PO TABS: 2000.0000 mg | ORAL_TABLET | Freq: Once | ORAL | Status: AC

## 2013-11-27 MED ORDER — METRONIDAZOLE 500 MG PO TABS: 2000.0000 mg | ORAL_TABLET | Freq: Once | ORAL | Status: DC

## 2013-11-27 NOTE — Telephone Encounter (Signed)
Called prescriptions in to pharmacy as they both printed.  Called pt. And left message stating we are calling with results and that we have sent two prescriptions to her [facility] and that she can call clinic for more information.

## 2013-11-27 NOTE — Telephone Encounter (Signed)
Pt with + trich and yeast. Will treat trich with flagyl 2gx1 and diflucan 150mg x1 Please call and inform Pending GC/C 

## 2013-11-27 NOTE — Telephone Encounter (Signed)
Pt with + trich and yeast. Will treat trich with flagyl 2gx1 and diflucan 150mg  x1 Please call and inform Pending GC/C

## 2013-11-29 ENCOUNTER — Ambulatory Visit (HOSPITAL_COMMUNITY)
Admission: RE | Admit: 2013-11-29 | Discharge: 2013-11-29 | Disposition: A | Discharge status: Home / Self Care | Payer: BC Managed Care – PPO | Source: Ambulatory Visit | Attending: Obstetrics & Gynecology | Admitting: Obstetrics & Gynecology

## 2013-11-29 ENCOUNTER — Other Ambulatory Visit: Payer: Self-pay | Admitting: Obstetrics & Gynecology

## 2013-11-29 DIAGNOSIS — O26879 Cervical shortening, unspecified trimester: Secondary | ICD-10-CM | POA: Insufficient documentation

## 2013-11-29 DIAGNOSIS — O26873 Cervical shortening, third trimester: Secondary | ICD-10-CM

## 2013-11-29 DIAGNOSIS — O0933 Supervision of pregnancy with insufficient antenatal care, third trimester: Secondary | ICD-10-CM

## 2013-11-29 DIAGNOSIS — O358XX0 Maternal care for other (suspected) fetal abnormality and damage, not applicable or unspecified: Secondary | ICD-10-CM | POA: Insufficient documentation

## 2013-11-29 DIAGNOSIS — Z363 Encounter for antenatal screening for malformations: Secondary | ICD-10-CM | POA: Insufficient documentation

## 2013-11-29 DIAGNOSIS — A749 Chlamydial infection, unspecified: Secondary | ICD-10-CM

## 2013-11-29 DIAGNOSIS — B009 Herpesviral infection, unspecified: Secondary | ICD-10-CM

## 2013-11-29 DIAGNOSIS — Z1389 Encounter for screening for other disorder: Secondary | ICD-10-CM | POA: Insufficient documentation

## 2013-11-29 DIAGNOSIS — O98812 Other maternal infectious and parasitic diseases complicating pregnancy, second trimester: Secondary | ICD-10-CM

## 2013-11-29 DIAGNOSIS — O98519 Other viral diseases complicating pregnancy, unspecified trimester: Secondary | ICD-10-CM

## 2013-11-29 DIAGNOSIS — O98212 Gonorrhea complicating pregnancy, second trimester: Secondary | ICD-10-CM

## 2013-11-29 DIAGNOSIS — O0930 Supervision of pregnancy with insufficient antenatal care, unspecified trimester: Secondary | ICD-10-CM | POA: Insufficient documentation

## 2013-11-29 LAB — HEMOGLOBINOPATHY EVALUATION
Hemoglobin Other: 0 %
Hgb A2 Quant: 2.9 % (ref 2.2–3.2)
Hgb A: 96.3 % — ABNORMAL LOW (ref 96.8–97.8)
Hgb F Quant: 0.8 % (ref 0.0–2.0)
Hgb S Quant: 0 %

## 2013-12-02 ENCOUNTER — Encounter (HOSPITAL_COMMUNITY): Payer: Self-pay

## 2013-12-02 DIAGNOSIS — O26873 Cervical shortening, third trimester: Secondary | ICD-10-CM | POA: Insufficient documentation

## 2013-12-02 MED ORDER — PROGESTERONE MICRONIZED 200 MG PO CAPS: ORAL_CAPSULE | ORAL | Status: DC

## 2013-12-03 ENCOUNTER — Telehealth: Payer: Self-pay | Admitting: *Deleted

## 2013-12-03 NOTE — Telephone Encounter (Addendum)
Message copied by Jill SideAY, DIANE L on Tue Dec 03, 2013  9:13 AM ------      Message from: Jaynie CollinsANYANWU, UGONNA A      Created: Mon Dec 02, 2013  7:44 AM       Short cervx seen on ultrasound.  Progesterone prescribed until 36 weeks.  Please call to inform patient of results and advise her to pick up prescription.        ------ Called pt and informed her of US findings of short cervix requiring medication treatment. I advised her to stop having intercourse and not to place anything in the vagina except for prescribed medication.  Pt voiced understanding of all information and instructions given. She will keep next clinic appt on 12/13/13 @ 0830.

## 2013-12-10 ENCOUNTER — Telehealth: Payer: Self-pay | Admitting: *Deleted

## 2013-12-10 DIAGNOSIS — O26873 Cervical shortening, third trimester: Secondary | ICD-10-CM

## 2013-12-10 MED ORDER — PROGESTERONE MICRONIZED 200 MG PO CAPS: ORAL_CAPSULE | ORAL | Status: DC

## 2013-12-10 NOTE — Telephone Encounter (Signed)
Pt called nurse line requesting to speak with Dr. Ike Benedom concerning medications, his recommendations for treatment, and to schedule her appts according to her school schedule.

## 2013-12-10 NOTE — Telephone Encounter (Signed)
Called pt and pt stated that she went to go pick up her Rx for [facility]rium and [facility] that [facility] had two other prescriptions and did not know what they were for cause no one tried to call her.  I informed pt that it looks like the Rx that were sent to her pharmacy were for tx of trich and a yeast infection.  Pt stated that she already got treated.  Looking at pt chart it indicates that she was treated for trich and then a TOC was completed on 11/26/13 in which resulted in her testing + again for trich and yeast.  I informed pt of this info.  Pt stated understanding.  Pt then asked if she could have a different Rx instead of the prometrium cause it was too expensive per Dr. Jolayne Pantheronstant there is not another Rx that can be prescribed. [facility] which [facility] may receive a cheaper cost on the medication.  I also emphasized the importance of the medication and that she can consult with the provider at her next visit. Pt stated "ok".

## 2013-12-13 ENCOUNTER — Ambulatory Visit (INDEPENDENT_AMBULATORY_CARE_PROVIDER_SITE_OTHER): Payer: BC Managed Care – PPO | Admitting: Family Medicine

## 2013-12-13 VITALS — BP 107/68 | Temp 97.2°F | Wt 181.4 lb

## 2013-12-13 DIAGNOSIS — O26873 Cervical shortening, third trimester: Secondary | ICD-10-CM

## 2013-12-13 DIAGNOSIS — O26879 Cervical shortening, unspecified trimester: Secondary | ICD-10-CM

## 2013-12-13 LAB — POCT URINALYSIS DIP (DEVICE)
Bilirubin Urine: NEGATIVE
GLUCOSE, UA: NEGATIVE mg/dL
Ketones, ur: NEGATIVE mg/dL
NITRITE: NEGATIVE
Protein, ur: NEGATIVE mg/dL
Specific Gravity, Urine: 1.015 (ref 1.005–1.030)
UROBILINOGEN UA: 0.2 mg/dL (ref 0.0–1.0)
pH: 7 (ref 5.0–8.0)

## 2013-12-13 NOTE — Progress Notes (Signed)
Pulse- 78 Pt has not taken tx for trich as that she has been at school.  Pt states will pick today.

## 2013-12-13 NOTE — Patient Instructions (Signed)
Third Trimester of Pregnancy  The third trimester is from week 29 through week 42, months 7 through 9. The third trimester is a time when the fetus is growing rapidly. At the end of the ninth month, the fetus is about 20 inches in length and weighs 6 10 pounds.   BODY CHANGES  Your body goes through many changes during pregnancy. The changes vary from woman to woman.    Your weight will continue to increase. You can expect to gain 25 35 pounds (11 16 kg) by the end of the pregnancy.   You may begin to get stretch marks on your hips, abdomen, and breasts.   You may urinate more often because the fetus is moving lower into your pelvis and pressing on your bladder.   You may develop or continue to have heartburn as a result of your pregnancy.   You may develop constipation because certain hormones are causing the muscles that push waste through your intestines to slow down.   You may develop hemorrhoids or swollen, bulging veins (varicose veins).   You may have pelvic pain because of the weight gain and pregnancy hormones relaxing your joints between the bones in your pelvis. Back aches may result from over exertion of the muscles supporting your posture.   Your breasts will continue to grow and be tender. A yellow discharge may leak from your breasts called colostrum.   Your belly button may stick out.   You may feel short of breath because of your expanding uterus.   You may notice the fetus "dropping," or moving lower in your abdomen.   You may have a bloody mucus discharge. This usually occurs a few days to a week before labor begins.   Your cervix becomes thin and soft (effaced) near your due date.  WHAT TO EXPECT AT YOUR PRENATAL EXAMS   You will have prenatal exams every 2 weeks until week 36. Then, you will have weekly prenatal exams. During a routine prenatal visit:   You will be weighed to make sure you and the fetus are growing normally.   Your blood pressure is taken.   Your abdomen will be  measured to track your baby's growth.   The fetal heartbeat will be listened to.   Any test results from the previous visit will be discussed.   You may have a cervical check near your due date to see if you have effaced.  At around 36 weeks, your caregiver will check your cervix. At the same time, your caregiver will also perform a test on the secretions of the vaginal tissue. This test is to determine if a type of bacteria, Group B streptococcus, is present. Your caregiver will explain this further.  Your caregiver may ask you:   What your birth plan is.   How you are feeling.   If you are feeling the baby move.   If you have had any abnormal symptoms, such as leaking fluid, bleeding, severe headaches, or abdominal cramping.   If you have any questions.  Other tests or screenings that may be performed during your third trimester include:   Blood tests that check for low iron levels (anemia).   Fetal testing to check the health, activity level, and growth of the fetus. Testing is done if you have certain medical conditions or if there are problems during the pregnancy.  FALSE LABOR  You may feel small, irregular contractions that eventually go away. These are called Braxton Hicks contractions, or   false labor. Contractions may last for hours, days, or even weeks before true labor sets in. If contractions come at regular intervals, intensify, or become painful, it is best to be seen by your caregiver.   SIGNS OF LABOR    Menstrual-like cramps.   Contractions that are 5 minutes apart or less.   Contractions that start on the top of the uterus and spread down to the lower abdomen and back.   A sense of increased pelvic pressure or back pain.   A watery or bloody mucus discharge that comes from the vagina.  If you have any of these signs before the 37th week of pregnancy, call your caregiver right away. You need to go to the hospital to get checked immediately.  HOME CARE INSTRUCTIONS    Avoid all  smoking, herbs, alcohol, and unprescribed drugs. These chemicals affect the formation and growth of the baby.   Follow your caregiver's instructions regarding medicine use. There are medicines that are either safe or unsafe to take during pregnancy.   Exercise only as directed by your caregiver. Experiencing uterine cramps is a good sign to stop exercising.   Continue to eat regular, healthy meals.   Wear a good support bra for breast tenderness.   Do not use hot tubs, steam rooms, or saunas.   Wear your seat belt at all times when driving.   Avoid raw meat, uncooked cheese, cat litter boxes, and soil used by cats. These carry germs that can cause birth defects in the baby.   Take your prenatal vitamins.   Try taking a stool softener (if your caregiver approves) if you develop constipation. Eat more high-fiber foods, such as fresh vegetables or fruit and whole grains. Drink plenty of fluids to keep your urine clear or pale yellow.   Take warm sitz baths to soothe any pain or discomfort caused by hemorrhoids. Use hemorrhoid cream if your caregiver approves.   If you develop varicose veins, wear support hose. Elevate your feet for 15 minutes, 3 4 times a day. Limit salt in your diet.   Avoid heavy lifting, wear low heal shoes, and practice good posture.   Rest a lot with your legs elevated if you have leg cramps or low back pain.   Visit your dentist if you have not gone during your pregnancy. Use a soft toothbrush to brush your teeth and be gentle when you floss.   A sexual relationship may be continued unless your caregiver directs you otherwise.   Do not travel far distances unless it is absolutely necessary and only with the approval of your caregiver.   Take prenatal classes to understand, practice, and ask questions about the labor and delivery.   Make a trial run to the hospital.   Pack your hospital bag.   Prepare the baby's nursery.   Continue to go to all your prenatal visits as directed  by your caregiver.  SEEK MEDICAL CARE IF:   You are unsure if you are in labor or if your water has broken.   You have dizziness.   You have mild pelvic cramps, pelvic pressure, or nagging pain in your abdominal area.   You have persistent nausea, vomiting, or diarrhea.   You have a bad smelling vaginal discharge.   You have pain with urination.  SEEK IMMEDIATE MEDICAL CARE IF:    You have a fever.   You are leaking fluid from your vagina.   You have spotting or bleeding from your vagina.     You have severe abdominal cramping or pain.   You have rapid weight loss or gain.   You have shortness of breath with chest pain.   You notice sudden or extreme swelling of your face, hands, ankles, feet, or legs.   You have not felt your baby move in over an hour.   You have severe headaches that do not go away with medicine.   You have vision changes.  Document Released: 11/08/2001 Document Revised: 07/17/2013 Document Reviewed: 01/15/2013  [employer] Patient Information 2014 [employer].

## 2013-12-13 NOTE — Progress Notes (Signed)
S: 18 yo G1 @ 31 weeks.    - pt is now on prometrium PV for shortened cervix incidentally noted at 29 weeks.  - hasn't taken treatement for trich. To pick up today.  - no ctx, lof, vb. +FM.   O: see flowsheet  A/P - doing well - no need to f/u cervical length given GA.  - measuring normally  - cont to f/u every 2 weeks - will need TOC  - PTL precautions discussed.

## 2013-12-27 ENCOUNTER — Encounter: Payer: Self-pay | Admitting: Family Medicine

## 2013-12-27 ENCOUNTER — Ambulatory Visit (INDEPENDENT_AMBULATORY_CARE_PROVIDER_SITE_OTHER): Payer: BC Managed Care – PPO | Admitting: Family Medicine

## 2013-12-27 VITALS — BP 107/75 | Temp 97.0°F | Wt 182.6 lb

## 2013-12-27 DIAGNOSIS — O0933 Supervision of pregnancy with insufficient antenatal care, third trimester: Secondary | ICD-10-CM

## 2013-12-27 DIAGNOSIS — O0930 Supervision of pregnancy with insufficient antenatal care, unspecified trimester: Secondary | ICD-10-CM

## 2013-12-27 LAB — POCT URINALYSIS DIP (DEVICE)
BILIRUBIN URINE: NEGATIVE
Glucose, UA: NEGATIVE mg/dL
Hgb urine dipstick: NEGATIVE
Ketones, ur: NEGATIVE mg/dL
Nitrite: NEGATIVE
PH: 6.5 (ref 5.0–8.0)
PROTEIN: NEGATIVE mg/dL
Specific Gravity, Urine: >=1.03 (ref 1.005–1.030)
Urobilinogen, UA: 0.2 mg/dL (ref 0.0–1.0)

## 2013-12-27 MED ORDER — VALACYCLOVIR HCL 500 MG PO TABS: 500.0000 mg | ORAL_TABLET | Freq: Two times a day (BID) | ORAL | Status: AC

## 2013-12-27 NOTE — Patient Instructions (Signed)
Third Trimester of Pregnancy  The third trimester is from week 29 through week 42, months 7 through 9. The third trimester is a time when the fetus is growing rapidly. At the end of the ninth month, the fetus is about 20 inches in length and weighs 6 10 pounds.   BODY CHANGES  Your body goes through many changes during pregnancy. The changes vary from woman to woman.    Your weight will continue to increase. You can expect to gain 25 35 pounds (11 16 kg) by the end of the pregnancy.   You may begin to get stretch marks on your hips, abdomen, and breasts.   You may urinate more often because the fetus is moving lower into your pelvis and pressing on your bladder.   You may develop or continue to have heartburn as a result of your pregnancy.   You may develop constipation because certain hormones are causing the muscles that push waste through your intestines to slow down.   You may develop hemorrhoids or swollen, bulging veins (varicose veins).   You may have pelvic pain because of the weight gain and pregnancy hormones relaxing your joints between the bones in your pelvis. Back aches may result from over exertion of the muscles supporting your posture.   Your breasts will continue to grow and be tender. A yellow discharge may leak from your breasts called colostrum.   Your belly button may stick out.   You may feel short of breath because of your expanding uterus.   You may notice the fetus "dropping," or moving lower in your abdomen.   You may have a bloody mucus discharge. This usually occurs a few days to a week before labor begins.   Your cervix becomes thin and soft (effaced) near your due date.  WHAT TO EXPECT AT YOUR PRENATAL EXAMS   You will have prenatal exams every 2 weeks until week 36. Then, you will have weekly prenatal exams. During a routine prenatal visit:   You will be weighed to make sure you and the fetus are growing normally.   Your blood pressure is taken.   Your abdomen will be  measured to track your baby's growth.   The fetal heartbeat will be listened to.   Any test results from the previous visit will be discussed.   You may have a cervical check near your due date to see if you have effaced.  At around 36 weeks, your caregiver will check your cervix. At the same time, your caregiver will also perform a test on the secretions of the vaginal tissue. This test is to determine if a type of bacteria, Group B streptococcus, is present. Your caregiver will explain this further.  Your caregiver may ask you:   What your birth plan is.   How you are feeling.   If you are feeling the baby move.   If you have had any abnormal symptoms, such as leaking fluid, bleeding, severe headaches, or abdominal cramping.   If you have any questions.  Other tests or screenings that may be performed during your third trimester include:   Blood tests that check for low iron levels (anemia).   Fetal testing to check the health, activity level, and growth of the fetus. Testing is done if you have certain medical conditions or if there are problems during the pregnancy.  FALSE LABOR  You may feel small, irregular contractions that eventually go away. These are called Braxton Hicks contractions, or   false labor. Contractions may last for hours, days, or even weeks before true labor sets in. If contractions come at regular intervals, intensify, or become painful, it is best to be seen by your caregiver.   SIGNS OF LABOR    Menstrual-like cramps.   Contractions that are 5 minutes apart or less.   Contractions that start on the top of the uterus and spread down to the lower abdomen and back.   A sense of increased pelvic pressure or back pain.   A watery or bloody mucus discharge that comes from the vagina.  If you have any of these signs before the 37th week of pregnancy, call your caregiver right away. You need to go to the hospital to get checked immediately.  HOME CARE INSTRUCTIONS    Avoid all  smoking, herbs, alcohol, and unprescribed drugs. These chemicals affect the formation and growth of the baby.   Follow your caregiver's instructions regarding medicine use. There are medicines that are either safe or unsafe to take during pregnancy.   Exercise only as directed by your caregiver. Experiencing uterine cramps is a good sign to stop exercising.   Continue to eat regular, healthy meals.   Wear a good support bra for breast tenderness.   Do not use hot tubs, steam rooms, or saunas.   Wear your seat belt at all times when driving.   Avoid raw meat, uncooked cheese, cat litter boxes, and soil used by cats. These carry germs that can cause birth defects in the baby.   Take your prenatal vitamins.   Try taking a stool softener (if your caregiver approves) if you develop constipation. Eat more high-fiber foods, such as fresh vegetables or fruit and whole grains. Drink plenty of fluids to keep your urine clear or pale yellow.   Take warm sitz baths to soothe any pain or discomfort caused by hemorrhoids. Use hemorrhoid cream if your caregiver approves.   If you develop varicose veins, wear support hose. Elevate your feet for 15 minutes, 3 4 times a day. Limit salt in your diet.   Avoid heavy lifting, wear low heal shoes, and practice good posture.   Rest a lot with your legs elevated if you have leg cramps or low back pain.   Visit your dentist if you have not gone during your pregnancy. Use a soft toothbrush to brush your teeth and be gentle when you floss.   A sexual relationship may be continued unless your caregiver directs you otherwise.   Do not travel far distances unless it is absolutely necessary and only with the approval of your caregiver.   Take prenatal classes to understand, practice, and ask questions about the labor and delivery.   Make a trial run to the hospital.   Pack your hospital bag.   Prepare the baby's nursery.   Continue to go to all your prenatal visits as directed  by your caregiver.  SEEK MEDICAL CARE IF:   You are unsure if you are in labor or if your water has broken.   You have dizziness.   You have mild pelvic cramps, pelvic pressure, or nagging pain in your abdominal area.   You have persistent nausea, vomiting, or diarrhea.   You have a bad smelling vaginal discharge.   You have pain with urination.  SEEK IMMEDIATE MEDICAL CARE IF:    You have a fever.   You are leaking fluid from your vagina.   You have spotting or bleeding from your vagina.     You have severe abdominal cramping or pain.   You have rapid weight loss or gain.   You have shortness of breath with chest pain.   You notice sudden or extreme swelling of your face, hands, ankles, feet, or legs.   You have not felt your baby move in over an hour.   You have severe headaches that do not go away with medicine.   You have vision changes.  Document Released: 11/08/2001 Document Revised: 07/17/2013 Document Reviewed: 01/15/2013  [employer] Patient Information 2014 [employer].

## 2013-12-27 NOTE — Addendum Note (Signed)
Addended by: Minta BalsamDOM, Atlas Crossland R on: 12/27/2013 09:14 AM   Modules accepted: Orders

## 2013-12-27 NOTE — Progress Notes (Signed)
+  FM, no lof, no vb , no ctx  Start valtrex at 35wks  Raven Tucker is a 18 y.o. G1P0 at 10552w0d here for ROB visit.  Discussed with Patient:  -Plans to breast feed.  All questions answered. -Continue prenatal vitamins. -Reviewed fetal kick counts Pt to perform daily at a time when the baby is active, lie laterally with both hands on belly in quiet room and count all movements (hiccups, shoulder rolls, obvious kicks, etc); pt is to report to clinic L&D for less than 10 movements felt in a one hour time period-pt told as soon as she counts 10 movements the count is complete.  - Routine precautions discussed (depression, infection s/s).   Patient provided with all pertinent phone numbers for emergencies. - RTC for any VB, regular, painful cramps/ctxs occurring at a rate of >2/10 min, fever (100.5 or higher), n/v/d, any pain that is unresolving or worsening, LOF, decreased fetal movement, CP, SOB, edema - RTC in 2 weeks for next appt. - Did GBS swabs today and will f/u results and call if abnormal.  Contact#:  Pt has no drug allergies, so will get PCN if GBS+ OR will get sensitivities on GBS swab as pt is PCN-allergic.  Problems: Patient Active Problem List   Diagnosis Date Noted  . Short cervix in third trimester, antepartum 12/02/2013  . Insufficient prenatal care in third trimester 11/26/2013  . Gonorrhea complicating pregnancy in second trimester 11/12/2012  . Chlamydia infection complicating pregnancy in second trimester 11/12/2012  . 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 Tdap will be given today  [ ]  Vaccines: Flu: declines  Tdap: 11/26/2013 [ ]  BCM: mirena [ ]  Readiness: baby has a place to sleep, car seat, other baby necessities.  Edu: [x ] PTL precautions; [ ]  BF class; [ ]  childbirth class; [ ]   BF counseling;

## 2013-12-27 NOTE — Progress Notes (Signed)
Pulse- 74 Patient reports occasional pelvic pressure with walking

## 2013-12-30 ENCOUNTER — Ambulatory Visit (HOSPITAL_COMMUNITY): Payer: BC Managed Care – PPO

## 2014-01-03 ENCOUNTER — Encounter: Payer: Self-pay | Admitting: Family Medicine

## 2014-01-03 ENCOUNTER — Ambulatory Visit (HOSPITAL_COMMUNITY)
Admission: RE | Admit: 2014-01-03 | Discharge: 2014-01-03 | Disposition: A | Discharge status: Home / Self Care | Payer: BC Managed Care – PPO | Source: Ambulatory Visit | Attending: Family Medicine | Admitting: Family Medicine

## 2014-01-03 DIAGNOSIS — O3660X0 Maternal care for excessive fetal growth, unspecified trimester, not applicable or unspecified: Secondary | ICD-10-CM | POA: Insufficient documentation

## 2014-01-03 DIAGNOSIS — O26879 Cervical shortening, unspecified trimester: Secondary | ICD-10-CM | POA: Insufficient documentation

## 2014-01-03 DIAGNOSIS — O0933 Supervision of pregnancy with insufficient antenatal care, third trimester: Secondary | ICD-10-CM

## 2014-01-03 DIAGNOSIS — O0930 Supervision of pregnancy with insufficient antenatal care, unspecified trimester: Secondary | ICD-10-CM | POA: Insufficient documentation

## 2014-01-10 ENCOUNTER — Ambulatory Visit (INDEPENDENT_AMBULATORY_CARE_PROVIDER_SITE_OTHER): Payer: BC Managed Care – PPO | Admitting: Obstetrics & Gynecology

## 2014-01-10 ENCOUNTER — Encounter: Payer: Self-pay | Admitting: Obstetrics & Gynecology

## 2014-01-10 VITALS — BP 108/83 | Temp 97.9°F | Wt 186.9 lb

## 2014-01-10 DIAGNOSIS — O0930 Supervision of pregnancy with insufficient antenatal care, unspecified trimester: Secondary | ICD-10-CM

## 2014-01-10 DIAGNOSIS — O0933 Supervision of pregnancy with insufficient antenatal care, third trimester: Secondary | ICD-10-CM

## 2014-01-10 DIAGNOSIS — O98519 Other viral diseases complicating pregnancy, unspecified trimester: Secondary | ICD-10-CM

## 2014-01-10 DIAGNOSIS — B009 Herpesviral infection, unspecified: Secondary | ICD-10-CM

## 2014-01-10 LAB — POCT URINALYSIS DIP (DEVICE)
Bilirubin Urine: NEGATIVE
Glucose, UA: NEGATIVE mg/dL
Hgb urine dipstick: NEGATIVE
KETONES UR: NEGATIVE mg/dL
NITRITE: NEGATIVE
PROTEIN: NEGATIVE mg/dL
Specific Gravity, Urine: 1.015 (ref 1.005–1.030)
UROBILINOGEN UA: 0.2 mg/dL (ref 0.0–1.0)
pH: 7 (ref 5.0–8.0)

## 2014-01-10 MED ORDER — VALACYCLOVIR HCL 1 G PO TABS: 1000.0000 mg | ORAL_TABLET | Freq: Every day | ORAL | Status: DC

## 2014-01-10 NOTE — Patient Instructions (Signed)
Preterm Labor Information Preterm labor is when labor starts at less than 37 weeks of pregnancy. The normal length of a pregnancy is 39 to 41 weeks. CAUSES Often, there is no identifiable underlying cause as to why a woman goes into preterm labor. One of the most common known causes of preterm labor is infection. Infections of the uterus, cervix, vagina, amniotic sac, bladder, kidney, or even the lungs (pneumonia) can cause labor to start. Other suspected causes of preterm labor include:   Urogenital infections, such as yeast infections and bacterial vaginosis.   Uterine abnormalities (uterine shape, uterine septum, fibroids, or bleeding from the placenta).   A cervix that has been operated on (it may fail to stay closed).   Malformations in the fetus.   Multiple gestations (twins, triplets, and so on).   Breakage of the amniotic sac.  RISK FACTORS  Having a previous history of preterm labor.   Having premature rupture of membranes (PROM).   Having a placenta that covers the opening of the cervix (placenta previa).   Having a placenta that separates from the uterus (placental abruption).   Having a cervix that is too weak to hold the fetus in the uterus (incompetent cervix).   Having too much fluid in the amniotic sac (polyhydramnios).   Taking illegal drugs or smoking while pregnant.   Not gaining enough weight while pregnant.   Being younger than 18 and older than 20 years old.   Having a low socioeconomic status.   Being African American. SYMPTOMS Signs and symptoms of preterm labor include:   Menstrual-like cramps, abdominal pain, or back pain.  Uterine contractions that are regular, as frequent as six in an hour, regardless of their intensity (may be mild or painful).  Contractions that start on the top of the uterus and spread down to the lower abdomen and back.   A sense of increased pelvic pressure.   A watery or bloody mucus discharge that  comes from the vagina.  TREATMENT Depending on the length of the pregnancy and other circumstances, your health care provider may suggest bed rest. If necessary, there are medicines that can be given to stop contractions and to mature the fetal lungs. If labor happens before 34 weeks of pregnancy, a prolonged hospital stay may be recommended. Treatment depends on the condition of both you and the fetus.  WHAT SHOULD YOU DO IF YOU THINK YOU ARE IN PRETERM LABOR? Call your health care provider right away. You will need to go to the hospital to get checked immediately. HOW CAN YOU PREVENT PRETERM LABOR IN FUTURE PREGNANCIES? You should:   Stop smoking if you smoke.  Maintain healthy weight gain and avoid chemicals and drugs that are not necessary.  Be watchful for any type of infection.  Inform your health care provider if you have a known history of preterm labor. Document Released: 02/04/2004 Document Revised: 07/17/2013 Document Reviewed: 12/17/2012 [employer] Patient Information 2014 [employer].    

## 2014-01-10 NOTE — Progress Notes (Signed)
P-78 

## 2014-01-10 NOTE — Progress Notes (Signed)
No problems. Start HSV suppression today, Rx Valtrex

## 2014-01-24 ENCOUNTER — Encounter: Payer: BC Managed Care – PPO | Admitting: Obstetrics and Gynecology

## 2014-01-28 ENCOUNTER — Telehealth: Payer: Self-pay | Admitting: General Practice

## 2014-01-28 ENCOUNTER — Encounter: Payer: Self-pay | Admitting: Family Medicine

## 2014-01-28 NOTE — Telephone Encounter (Signed)
Patient called and left message stating she really needs to speak with Dr Ike Benedom about taking time off school she is available today between 9-11 and after 5pm. Called patient, no answer- left message that we are  Trying to return your phone call, please call us back at the clinics. Note: patient currently does not have an appt scheduled

## 2014-01-29 ENCOUNTER — Encounter: Payer: Self-pay | Admitting: Family Medicine

## 2014-01-30 NOTE — Telephone Encounter (Signed)
Spoke with pt via Mychart.

## 2014-02-05 ENCOUNTER — Encounter: Payer: Self-pay | Admitting: Family Medicine

## 2014-02-05 ENCOUNTER — Ambulatory Visit (INDEPENDENT_AMBULATORY_CARE_PROVIDER_SITE_OTHER): Payer: BC Managed Care – PPO | Admitting: Family Medicine

## 2014-02-05 ENCOUNTER — Encounter: Payer: Self-pay | Admitting: Advanced Practice Midwife

## 2014-02-05 VITALS — BP 129/94 | Temp 98.5°F | Wt 197.7 lb

## 2014-02-05 DIAGNOSIS — O26873 Cervical shortening, third trimester: Secondary | ICD-10-CM

## 2014-02-05 DIAGNOSIS — O26879 Cervical shortening, unspecified trimester: Secondary | ICD-10-CM

## 2014-02-05 DIAGNOSIS — O0930 Supervision of pregnancy with insufficient antenatal care, unspecified trimester: Secondary | ICD-10-CM

## 2014-02-05 DIAGNOSIS — O0933 Supervision of pregnancy with insufficient antenatal care, third trimester: Secondary | ICD-10-CM

## 2014-02-05 LAB — POCT URINALYSIS DIP (DEVICE)
Bilirubin Urine: NEGATIVE
GLUCOSE, UA: NEGATIVE mg/dL
Hgb urine dipstick: NEGATIVE
Ketones, ur: NEGATIVE mg/dL
NITRITE: NEGATIVE
Protein, ur: NEGATIVE mg/dL
Specific Gravity, Urine: 1.01 (ref 1.005–1.030)
UROBILINOGEN UA: 0.2 mg/dL (ref 0.0–1.0)
pH: 7 (ref 5.0–8.0)

## 2014-02-05 NOTE — Progress Notes (Signed)
+  FM, no lof, no vb, intermittent ctx  Raven Tucker is a 18 y.o. G1P0 at 5439w5d here for ROB visit.    Discussed with Patient:  - Plans to breast/bottle feed.  All questions answered. - Continue prenatal vitamins. - Reviewed fetal kick counts Pt to perform daily at a time when the baby is active, lie laterally with both hands on belly in quiet room and count all movements (hiccups, shoulder rolls, obvious kicks, etc); pt is to report to clinic MAU for less than 10 movements felt in a 2 hour time period-pt told as soon as she counts 10 movements the count is complete.  - Routine precautions discussed (depression, infection s/s).   Patient provided with all pertinent phone numbers for emergencies. - RTC for any VB, regular, painful cramps/ctxs occurring at a rate of >2/10 min, fever (100.5 or higher), n/v/d, any pain that is unresolving or worsening, LOF, decreased fetal movement, CP, SOB, edema -RTC in one week for next visit.  Problems: Patient Active Problem List   Diagnosis Date Noted  . Short cervix in third trimester, antepartum 12/02/2013  . Insufficient prenatal care in third trimester 11/26/2013  . Gonorrhea complicating pregnancy in second trimester 11/12/2012  . Chlamydia infection complicating pregnancy in second trimester 11/12/2012  . 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   [ ]  Vaccines: recd [ ]  BCM: mirena [ ]  Readiness: baby has a place to sleep, car seat, other baby necessities.  Edu: [ x] TL precautions; [ ]  BF class; [ ]  childbirth class; [ ]   BF counseling;

## 2014-02-05 NOTE — Progress Notes (Signed)
P= 64 C/o of intermittent lower abdominal/pelvic pressure and low back pain.  GBS and cultures today

## 2014-02-05 NOTE — Patient Instructions (Signed)
Third Trimester of Pregnancy  The third trimester is from week 29 through week 42, months 7 through 9. The third trimester is a time when the fetus is growing rapidly. At the end of the ninth month, the fetus is about 20 inches in length and weighs 6 10 pounds.   BODY CHANGES  Your body goes through many changes during pregnancy. The changes vary from woman to woman.    Your weight will continue to increase. You can expect to gain 25 35 pounds (11 16 kg) by the end of the pregnancy.   You may begin to get stretch marks on your hips, abdomen, and breasts.   You may urinate more often because the fetus is moving lower into your pelvis and pressing on your bladder.   You may develop or continue to have heartburn as a result of your pregnancy.   You may develop constipation because certain hormones are causing the muscles that push waste through your intestines to slow down.   You may develop hemorrhoids or swollen, bulging veins (varicose veins).   You may have pelvic pain because of the weight gain and pregnancy hormones relaxing your joints between the bones in your pelvis. Back aches may result from over exertion of the muscles supporting your posture.   Your breasts will continue to grow and be tender. A yellow discharge may leak from your breasts called colostrum.   Your belly button may stick out.   You may feel short of breath because of your expanding uterus.   You may notice the fetus "dropping," or moving lower in your abdomen.   You may have a bloody mucus discharge. This usually occurs a few days to a week before labor begins.   Your cervix becomes thin and soft (effaced) near your due date.  WHAT TO EXPECT AT YOUR PRENATAL EXAMS   You will have prenatal exams every 2 weeks until week 36. Then, you will have weekly prenatal exams. During a routine prenatal visit:   You will be weighed to make sure you and the fetus are growing normally.   Your blood pressure is taken.   Your abdomen will be  measured to track your baby's growth.   The fetal heartbeat will be listened to.   Any test results from the previous visit will be discussed.   You may have a cervical check near your due date to see if you have effaced.  At around 36 weeks, your caregiver will check your cervix. At the same time, your caregiver will also perform a test on the secretions of the vaginal tissue. This test is to determine if a type of bacteria, Group B streptococcus, is present. Your caregiver will explain this further.  Your caregiver may ask you:   What your birth plan is.   How you are feeling.   If you are feeling the baby move.   If you have had any abnormal symptoms, such as leaking fluid, bleeding, severe headaches, or abdominal cramping.   If you have any questions.  Other tests or screenings that may be performed during your third trimester include:   Blood tests that check for low iron levels (anemia).   Fetal testing to check the health, activity level, and growth of the fetus. Testing is done if you have certain medical conditions or if there are problems during the pregnancy.  FALSE LABOR  You may feel small, irregular contractions that eventually go away. These are called Braxton Hicks contractions, or   false labor. Contractions may last for hours, days, or even weeks before true labor sets in. If contractions come at regular intervals, intensify, or become painful, it is best to be seen by your caregiver.   SIGNS OF LABOR    Menstrual-like cramps.   Contractions that are 5 minutes apart or less.   Contractions that start on the top of the uterus and spread down to the lower abdomen and back.   A sense of increased pelvic pressure or back pain.   A watery or bloody mucus discharge that comes from the vagina.  If you have any of these signs before the 37th week of pregnancy, call your caregiver right away. You need to go to the hospital to get checked immediately.  HOME CARE INSTRUCTIONS    Avoid all  smoking, herbs, alcohol, and unprescribed drugs. These chemicals affect the formation and growth of the baby.   Follow your caregiver's instructions regarding medicine use. There are medicines that are either safe or unsafe to take during pregnancy.   Exercise only as directed by your caregiver. Experiencing uterine cramps is a good sign to stop exercising.   Continue to eat regular, healthy meals.   Wear a good support bra for breast tenderness.   Do not use hot tubs, steam rooms, or saunas.   Wear your seat belt at all times when driving.   Avoid raw meat, uncooked cheese, cat litter boxes, and soil used by cats. These carry germs that can cause birth defects in the baby.   Take your prenatal vitamins.   Try taking a stool softener (if your caregiver approves) if you develop constipation. Eat more high-fiber foods, such as fresh vegetables or fruit and whole grains. Drink plenty of fluids to keep your urine clear or pale yellow.   Take warm sitz baths to soothe any pain or discomfort caused by hemorrhoids. Use hemorrhoid cream if your caregiver approves.   If you develop varicose veins, wear support hose. Elevate your feet for 15 minutes, 3 4 times a day. Limit salt in your diet.   Avoid heavy lifting, wear low heal shoes, and practice good posture.   Rest a lot with your legs elevated if you have leg cramps or low back pain.   Visit your dentist if you have not gone during your pregnancy. Use a soft toothbrush to brush your teeth and be gentle when you floss.   A sexual relationship may be continued unless your caregiver directs you otherwise.   Do not travel far distances unless it is absolutely necessary and only with the approval of your caregiver.   Take prenatal classes to understand, practice, and ask questions about the labor and delivery.   Make a trial run to the hospital.   Pack your hospital bag.   Prepare the baby's nursery.   Continue to go to all your prenatal visits as directed  by your caregiver.  SEEK MEDICAL CARE IF:   You are unsure if you are in labor or if your water has broken.   You have dizziness.   You have mild pelvic cramps, pelvic pressure, or nagging pain in your abdominal area.   You have persistent nausea, vomiting, or diarrhea.   You have a bad smelling vaginal discharge.   You have pain with urination.  SEEK IMMEDIATE MEDICAL CARE IF:    You have a fever.   You are leaking fluid from your vagina.   You have spotting or bleeding from your vagina.     You have severe abdominal cramping or pain.   You have rapid weight loss or gain.   You have shortness of breath with chest pain.   You notice sudden or extreme swelling of your face, hands, ankles, feet, or legs.   You have not felt your baby move in over an hour.   You have severe headaches that do not go away with medicine.   You have vision changes.  Document Released: 11/08/2001 Document Revised: 07/17/2013 Document Reviewed: 01/15/2013  [employer] Patient Information 2014 [employer].

## 2014-02-06 LAB — GC/CHLAMYDIA PROBE AMP
CT Probe RNA: NEGATIVE
GC Probe RNA: NEGATIVE

## 2014-02-09 ENCOUNTER — Encounter: Payer: Self-pay | Admitting: Family Medicine

## 2014-02-12 ENCOUNTER — Ambulatory Visit (INDEPENDENT_AMBULATORY_CARE_PROVIDER_SITE_OTHER): Payer: BC Managed Care – PPO | Admitting: Advanced Practice Midwife

## 2014-02-12 ENCOUNTER — Encounter: Payer: Self-pay | Admitting: Advanced Practice Midwife

## 2014-02-12 VITALS — BP 127/88 | Wt 195.5 lb

## 2014-02-12 DIAGNOSIS — O26879 Cervical shortening, unspecified trimester: Secondary | ICD-10-CM

## 2014-02-12 DIAGNOSIS — O98219 Gonorrhea complicating pregnancy, unspecified trimester: Secondary | ICD-10-CM

## 2014-02-12 DIAGNOSIS — O0930 Supervision of pregnancy with insufficient antenatal care, unspecified trimester: Secondary | ICD-10-CM

## 2014-02-12 DIAGNOSIS — O26873 Cervical shortening, third trimester: Secondary | ICD-10-CM

## 2014-02-12 LAB — POCT URINALYSIS DIP (DEVICE)
Bilirubin Urine: NEGATIVE
Glucose, UA: NEGATIVE mg/dL
HGB URINE DIPSTICK: NEGATIVE
Ketones, ur: NEGATIVE mg/dL
NITRITE: NEGATIVE
PH: 6.5 (ref 5.0–8.0)
Protein, ur: NEGATIVE mg/dL
Specific Gravity, Urine: 1.015 (ref 1.005–1.030)
UROBILINOGEN UA: 0.2 mg/dL (ref 0.0–1.0)

## 2014-02-12 NOTE — Patient Instructions (Signed)

## 2014-02-12 NOTE — Progress Notes (Signed)
Pulse: 71

## 2014-02-12 NOTE — Progress Notes (Signed)
Doing well. Labor precautuions. C/O three bumps on legs, appear to be insect bites

## 2014-02-13 LAB — CULTURE, STREPTOCOCCUS GRP B W/SUSCEPT

## 2014-02-19 ENCOUNTER — Encounter: Payer: BC Managed Care – PPO | Admitting: Family Medicine

## 2014-03-08 ENCOUNTER — Other Ambulatory Visit: Payer: Self-pay | Admitting: Obstetrics & Gynecology

## 2014-03-21 ENCOUNTER — Ambulatory Visit (INDEPENDENT_AMBULATORY_CARE_PROVIDER_SITE_OTHER): Payer: BC Managed Care – PPO | Admitting: Obstetrics & Gynecology

## 2014-03-21 ENCOUNTER — Encounter: Payer: Self-pay | Admitting: Obstetrics & Gynecology

## 2014-03-21 VITALS — BP 119/87 | HR 69 | Temp 98.1°F | Wt 177.1 lb

## 2014-03-21 DIAGNOSIS — IMO0001 Reserved for inherently not codable concepts without codable children: Secondary | ICD-10-CM

## 2014-03-21 DIAGNOSIS — Z30430 Encounter for insertion of intrauterine contraceptive device: Secondary | ICD-10-CM

## 2014-03-21 LAB — POCT PREGNANCY, URINE: Preg Test, Ur: NEGATIVE

## 2014-03-21 MED ORDER — LEVONORGESTREL 20 MCG/24HR IU IUD
INTRAUTERINE_SYSTEM | Freq: Once | INTRAUTERINE | Status: AC
  Administered 2014-03-21: 1 via INTRAUTERINE

## 2014-03-21 NOTE — Addendum Note (Signed)
Addended by: Sherre LainASH, AMANDA A on: 03/21/2014 10:56 AM   Modules accepted: Orders

## 2014-03-21 NOTE — Progress Notes (Signed)
Patient ID: Raven Tucker, female   DOB: October 12, 1996, 18 y.o.   MRN: [id number] Subjective:     Raven LomaSherrelle Staszak is a 18 y.o. female who presents for a postpartum visit. She is 5 weeks postpartum following a spontaneous vaginal delivery. I have fully reviewed the prenatal and intrapartum course. The delivery was at 38 6/7 gestational weeks. Outcome: spontaneous vaginal delivery. Anesthesia: epidural. Postpartum course has been uncomplicated. Baby's course has been uncomplicated. Baby is feeding by breast. Bleeding no bleeding. Bowel function is normal. Bladder function is normal. Patient is not sexually active. Contraception method is abstinence. Postpartum depression screening: negative.     Review of Systems A comprehensive review of systems was negative.   Objective:    BP 119/87  Pulse 69  Temp(Src) 98.1 F (36.7 C)  Wt 177 lb 1.6 oz (80.332 kg)  LMP 04/01/2013  Breastfeeding? Unknown  General:  alert and no distress           Abdomen: soft, non-tender; bowel sounds normal; no masses,  no organomegaly   Vulva:  normal  Vagina: normal vagina  Cervix:  no cervical motion tenderness  Corpus: not examined  Adnexa:  not evaluated  Rectal Exam: Not performed.       GYNECOLOGY CLINIC PROCEDURE NOTE   IUD Insertion Procedure Note Patient identified, informed consent performed.  Discussed risks of irregular bleeding, cramping, infection, malpositioning or misplacement of the IUD outside the uterus which may require further procedures. Time out was performed.  Urine pregnancy test negative.  Speculum placed in the vagina.  Cervix visualized.  Cleaned with Betadine x 2.  Grasped anteriorly with a single tooth tenaculum.  Uterus sounded to 8 cm.  Mirena IUD placed per manufacturer's recommendations.  Strings trimmed to 3 cm. Tenaculum was removed, good hemostasis noted.  Patient tolerated procedure well.    Assessment:     5 weeks postpartum exam. Pap smear not done at today's visit.    Plan:    1. Contraception: IUD 2. Patient was given post-procedure instructions.  Patient was also asked to check IUD strings periodically and follow up in 4 weeks for IUD check.

## 2014-03-21 NOTE — Patient Instructions (Signed)
Levonorgestrel intrauterine device (IUD) What is this medicine? LEVONORGESTREL IUD (LEE voe nor jes trel) is a contraceptive (birth control) device. The device is placed inside the uterus by a healthcare professional. It is used to prevent pregnancy and can also be used to treat heavy bleeding that occurs during your period. Depending on the device, it can be used for 3 to 5 years. This medicine may be used for other purposes; ask your health care provider or pharmacist if you have questions. COMMON BRAND NAME(S): Mirena, Skyla What should I tell my health care provider before I take this medicine? They need to know if you have any of these conditions: -abnormal Pap smear -cancer of the breast, uterus, or cervix -diabetes -endometritis -genital or pelvic infection now or in the past -have more than one sexual partner or your partner has more than one partner -heart disease -history of an ectopic or tubal pregnancy -immune system problems -IUD in place -liver disease or tumor -problems with blood clots or take blood-thinners -use intravenous drugs -uterus of unusual shape -vaginal bleeding that has not been explained -an unusual or allergic reaction to levonorgestrel, other hormones, silicone, or polyethylene, medicines, foods, dyes, or preservatives -pregnant or trying to get pregnant -breast-feeding How should I use this medicine? This device is placed inside the uterus by a health care professional. Talk to your pediatrician regarding the use of this medicine in children. Special care may be needed. Overdosage: If you think you have taken too much of this medicine contact a poison control center or emergency room at once. NOTE: This medicine is only for you. Do not share this medicine with others. What if I miss a dose? This does not apply. What may interact with this medicine? Do not take this medicine with any of the following  medications: -amprenavir -bosentan -fosamprenavir This medicine may also interact with the following medications: -aprepitant -barbiturate medicines for inducing sleep or treating seizures -bexarotene -griseofulvin -medicines to treat seizures like carbamazepine, ethotoin, felbamate, oxcarbazepine, phenytoin, topiramate -modafinil -pioglitazone -rifabutin -rifampin -rifapentine -some medicines to treat HIV infection like atazanavir, indinavir, lopinavir, nelfinavir, tipranavir, ritonavir -St. John's wort -warfarin This list may not describe all possible interactions. Give your health care provider a list of all the medicines, herbs, non-prescription drugs, or dietary supplements you use. Also tell them if you smoke, drink alcohol, or use illegal drugs. Some items may interact with your medicine. What should I watch for while using this medicine? Visit your doctor or health care professional for regular check ups. See your doctor if you or your partner has sexual contact with others, becomes HIV positive, or gets a sexual transmitted disease. This product does not protect you against HIV infection (AIDS) or other sexually transmitted diseases. You can check the placement of the IUD yourself by reaching up to the top of your vagina with clean fingers to feel the threads. Do not pull on the threads. It is a good habit to check placement after each menstrual period. Call your doctor right away if you feel more of the IUD than just the threads or if you cannot feel the threads at all. The IUD may come out by itself. You may become pregnant if the device comes out. If you notice that the IUD has come out use a backup birth control method like condoms and call your health care provider. Using tampons will not change the position of the IUD and are okay to use during your period. What side effects may I   notice from receiving this medicine? Side effects that you should report to your doctor or  health care professional as soon as possible: -allergic reactions like skin rash, itching or hives, swelling of the face, lips, or tongue -fever, flu-like symptoms -genital sores -high blood pressure -no menstrual period for 6 weeks during use -pain, swelling, warmth in the leg -pelvic pain or tenderness -severe or sudden headache -signs of pregnancy -stomach cramping -sudden shortness of breath -trouble with balance, talking, or walking -unusual vaginal bleeding, discharge -yellowing of the eyes or skin Side effects that usually do not require medical attention (report to your doctor or health care professional if they continue or are bothersome): -acne -breast pain -change in sex drive or performance -changes in weight -cramping, dizziness, or faintness while the device is being inserted -headache -irregular menstrual bleeding within first 3 to 6 months of use -nausea This list may not describe all possible side effects. Call your doctor for medical advice about side effects. You may report side effects to FDA at 1-800-FDA-1088. Where should I keep my medicine? This does not apply. NOTE: This sheet is a summary. It may not cover all possible information. If you have questions about this medicine, talk to your doctor, pharmacist, or health care provider.  2014, Elsevier/Gold Standard. (2011-12-15 13:54:04)  

## 2014-03-21 NOTE — Addendum Note (Signed)
Addended by: Aldona LentoFISHER, Sativa Gelles L on: 03/21/2014 10:00 AM   Modules accepted: Orders

## 2014-03-22 LAB — GC/CHLAMYDIA PROBE AMP
CT Probe RNA: NEGATIVE
GC Probe RNA: NEGATIVE

## 2014-03-24 ENCOUNTER — Encounter: Payer: Self-pay | Admitting: *Deleted

## 2014-04-10 ENCOUNTER — Encounter: Payer: Self-pay | Admitting: Family Medicine

## 2014-04-25 ENCOUNTER — Ambulatory Visit (INDEPENDENT_AMBULATORY_CARE_PROVIDER_SITE_OTHER): Payer: BC Managed Care – PPO | Admitting: Obstetrics & Gynecology

## 2014-04-25 ENCOUNTER — Encounter: Payer: Self-pay | Admitting: Obstetrics & Gynecology

## 2014-04-25 VITALS — BP 115/81 | HR 63 | Ht 61.0 in | Wt 182.1 lb

## 2014-04-25 DIAGNOSIS — N760 Acute vaginitis: Secondary | ICD-10-CM

## 2014-04-25 DIAGNOSIS — A499 Bacterial infection, unspecified: Secondary | ICD-10-CM

## 2014-04-25 DIAGNOSIS — B009 Herpesviral infection, unspecified: Secondary | ICD-10-CM

## 2014-04-25 DIAGNOSIS — B9689 Other specified bacterial agents as the cause of diseases classified elsewhere: Secondary | ICD-10-CM

## 2014-04-25 MED ORDER — METRONIDAZOLE 500 MG PO TABS: 500.0000 mg | ORAL_TABLET | Freq: Two times a day (BID) | ORAL | Status: AC

## 2014-04-25 MED ORDER — VALACYCLOVIR HCL 500 MG PO TABS: 500.0000 mg | ORAL_TABLET | Freq: Two times a day (BID) | ORAL | Status: AC

## 2014-04-25 MED ORDER — METRONIDAZOLE 500 MG PO TABS
500.0000 mg | ORAL_TABLET | Freq: Two times a day (BID) | ORAL | Status: DC
Start: 2014-04-25 — End: 2017-10-03

## 2014-04-25 NOTE — Progress Notes (Signed)
Subjective:     Patient ID: Raven Tucker, female   DOB: [birth year], 18 y.o.   MRN: [id number]  HPI Pt c/o spotting since IUD was placed 4 weeks prev.  She c/o numbness in her right arm for 3 days that she notes when she wakes up.  She is unable to take the Valtrex pills as they are too big.  She reports that she has taken the 500mg  tabs before and would rather take 2 a day of the smaller pills.   Review of Systems     Objective:   Physical Exam BP 115/81  Pulse 63  Ht 5\' 1"  (1.549 m)  Wt 182 lb 1.6 oz (82.6 kg)  BMI 34.43 kg/m2  LMP 03/28/2013 GU: EGBUS: no lesions Vagina: no blood in vault; + whiff test Cervix: no lesion; no mucopurulent d/c: IUD strings noted       Assessment:     IUD check- c/o spotting  BV Suspect carpel tunnel sx   Plan:     F/u in 1 year or sooner if needed Flagyl 500mg  bid x 7 days Valtrex will change from 1000mg  tabs to 500 2 po a day (as pt unable to swallow the larger tabs)  rec purchase cockup splint from drugstore

## 2014-04-25 NOTE — Patient Instructions (Signed)

## 2014-04-25 NOTE — Progress Notes (Signed)
Patient reports irregular bleeding, advised that this is normal with iud.  Patient also reports that her right arm goes numb at night.

## 2014-09-10 IMAGING — US US OB FOLLOW-UP
1 series · 12 of 28 positions shown · non-contrast
Comparison: none

[Series 1: us ob follow up · 37 acquisitions, 12 frames shown]
[im 2/37]
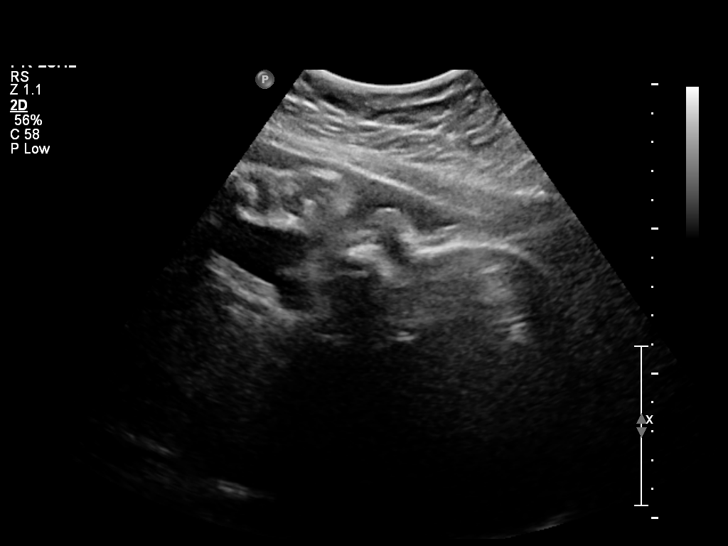
[im 5/37]
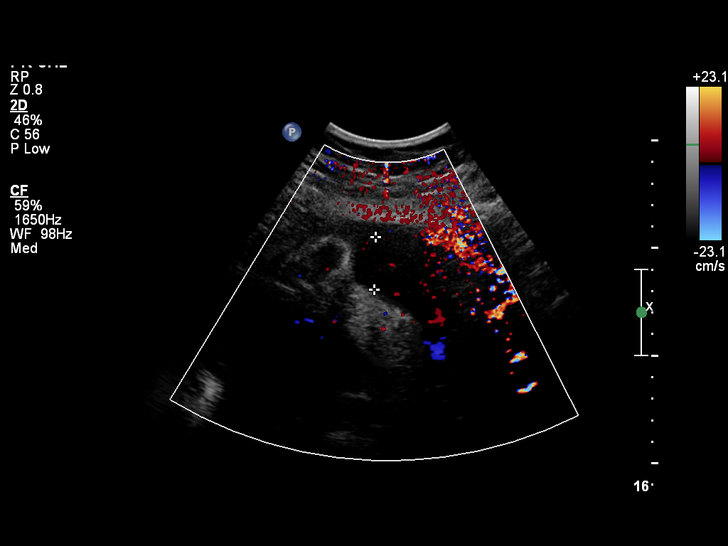
[im 7/37]
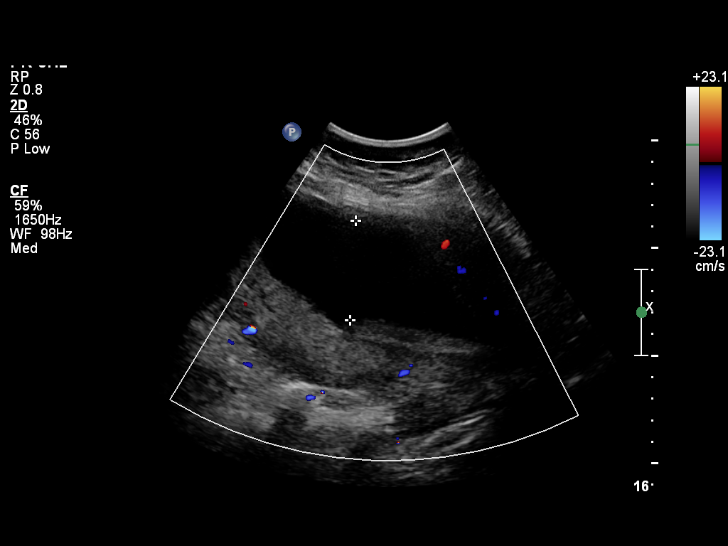
[im 11/37]
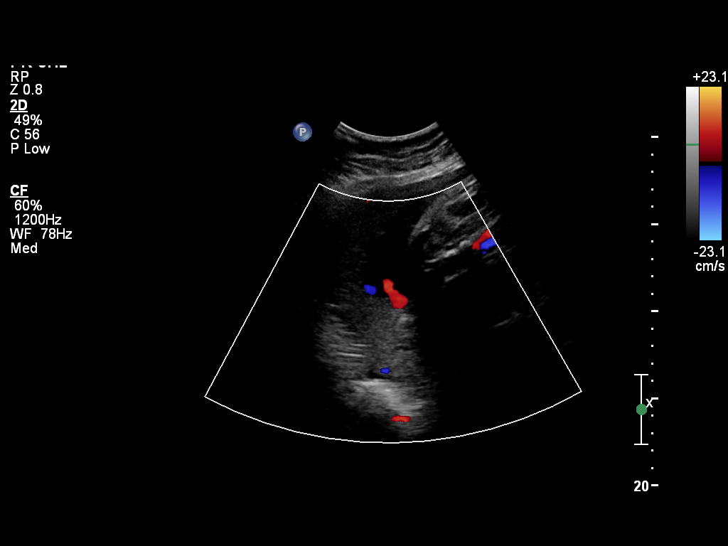
[im 14/37]
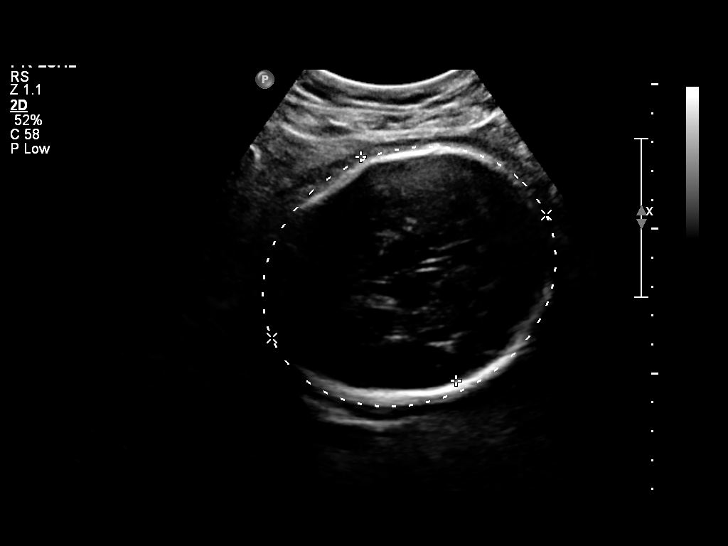
[im 17/37]
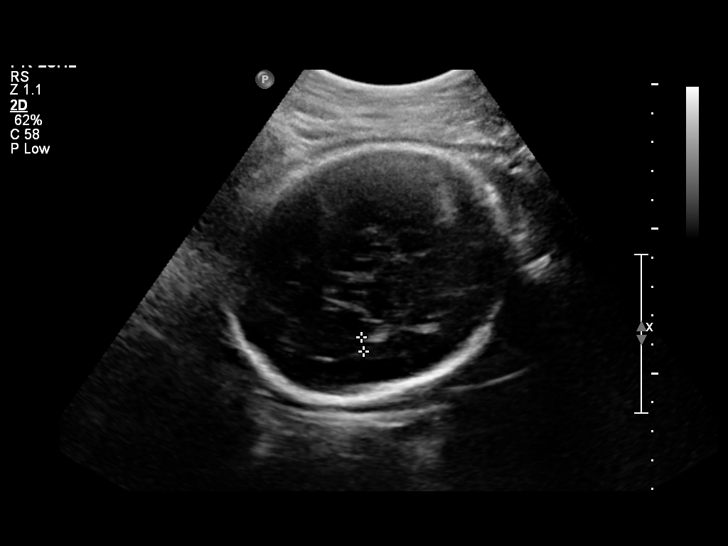
[im 21/37]
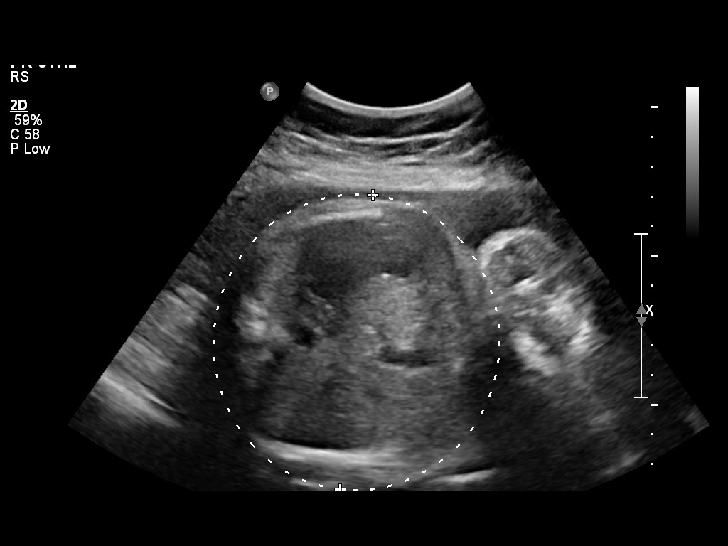
[im 23/37]
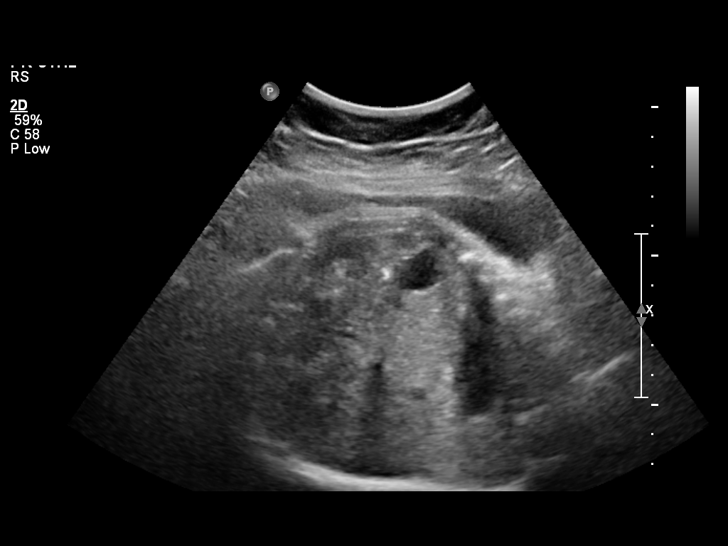
[im 26/37]
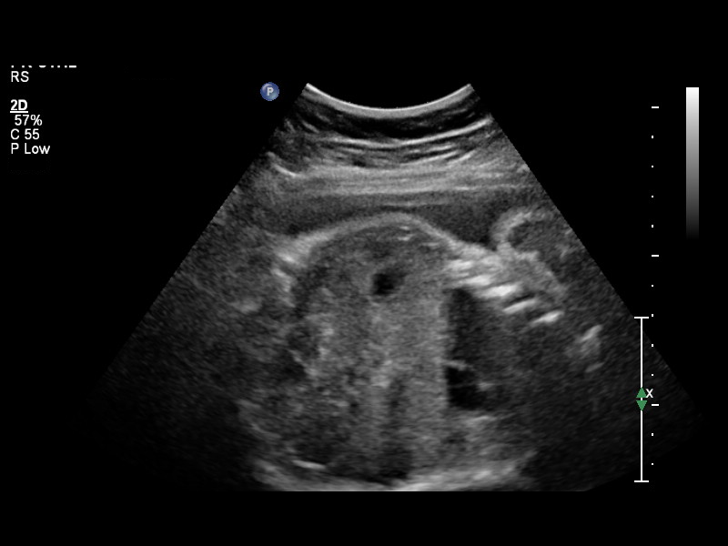
[im 30/37]
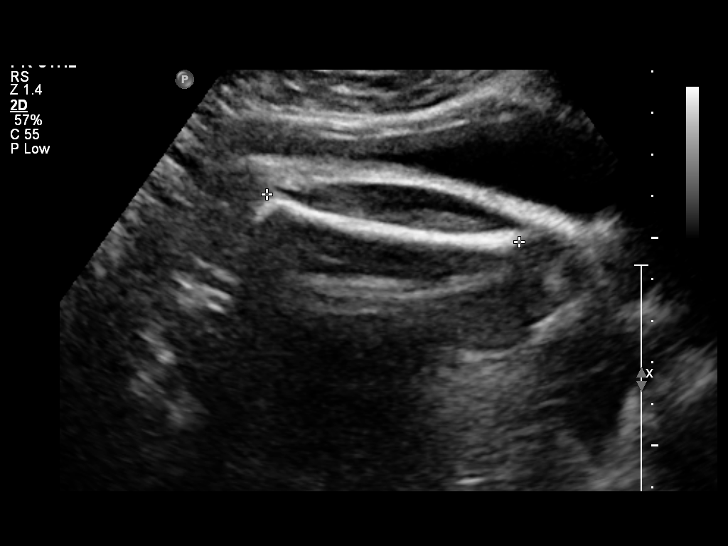
[im 33/37]
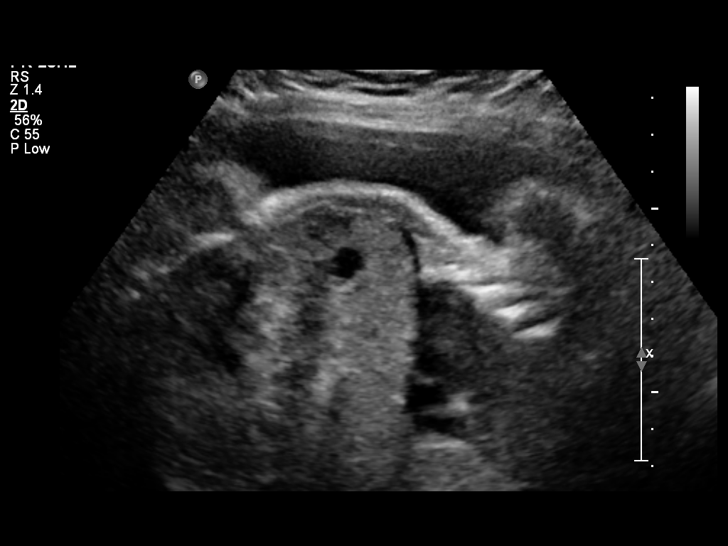
[im 35/37]
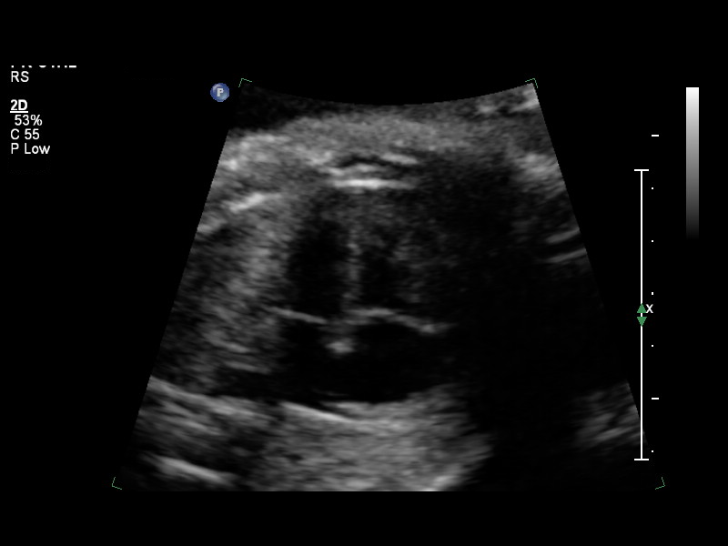

[12 of 28 positions shown; findings below may reference images not displayed]

OBSTETRICS REPORT
                      (Signed Final 01/03/2014 [DATE])

Service(s) Provided

 US OB FOLLOW UP                                       76816.1
Indications

 No or Little Prenatal Care
 Cervical shortening
 Size greater than dates (Large for gestational [AGE]
Fetal Evaluation

 Num Of Fetuses:    1
 Fetal Heart Rate:  146                          bpm
 Cardiac Activity:  Observed
 Presentation:      Cephalic
 Placenta:          Posterior, above cervical
                    os
 P. Cord            Previously Visualized
 Insertion:

 Amniotic Fluid
 AFI FV:      Subjectively within normal limits
 AFI Sum:     19.03   cm       71  %Tile     Larg Pckt:    8.06  cm
 RUQ:   8.06    cm   RLQ:    2.44   cm    LUQ:   4.61    cm   LLQ:    3.92   cm
Biometry

 BPD:     83.2  mm     G. Age:  33w 3d                CI:        74.78   70 - 86
                                                      FL/HC:      20.0   19.4 -

 HC:     305.3  mm     G. Age:  34w 0d       17  %    HC/AC:      1.02   0.96 -

 AC:     300.2  mm     G. Age:  34w 0d       53  %    FL/BPD:     73.4   71 - 87
 FL:      61.1  mm     G. Age:  31w 5d        3  %    FL/AC:      20.4   20 - 24

 Est. FW:    6951  gm    4 lb 12 oz      45  %
Gestational Age

 LMP:           39w 4d        Date:  04/01/13                 EDD:   01/06/14
 Clinical EDD:  34w 0d                                        EDD:   02/14/14
 U/S Today:     33w 2d                                        EDD:   02/19/14
 Best:          34w 0d     Det. By:  Clinical EDD             EDD:   02/14/14
Anatomy
 Cranium:          Appears normal         Aortic Arch:      Previously seen
 Fetal Cavum:      Previously seen        Ductal Arch:      Previously seen
 Ventricles:       Appears normal         Diaphragm:        Appears normal
 Choroid Plexus:   Previously seen        Stomach:          Appears normal
 Cerebellum:       Previously seen        Abdomen:          Previously seen
 Posterior Fossa:  Previously seen        Abdominal Wall:   Previously seen
 Nuchal Fold:      Not applicable (>20    Cord Vessels:     Previously seen
                   wks GA)
 Face:             Orbits and profile     Kidneys:          Appear normal
                   previously seen
 Lips:             Previously seen        Bladder:          Appears normal
 Heart:            Appears normal         Spine:            Previously seen
                   (4CH, axis, and
                   situs)
 RVOT:             Previously seen        Lower             Previously seen
                                          Extremities:
 LVOT:             Previously seen        Upper             Previously seen
                                          Extremities:

 Other:  Fetus appears to be a male. Heels and 5th digit previously seen.
         Nasal bone pre. visualized. Technically difficult due to advanced
         gestational age.
Cervix Uterus Adnexa

 Cervix:       Not visualized (advanced GA >26wks)
Impression

 IUP at 34+0 weeks
 Normal interval anatomy; anatomic survey complete
 Normal amniotic fluid volume
 Appropriate interval growth with EFW at the 45th %tile
Recommendations

 Follow-up as clinically indicated

## 2014-09-29 ENCOUNTER — Encounter: Payer: Self-pay | Admitting: Obstetrics & Gynecology

## 2016-01-17 ENCOUNTER — Ambulatory Visit: Payer: Self-pay

## 2017-10-03 ENCOUNTER — Encounter: Payer: Self-pay | Admitting: Obstetrics and Gynecology

## 2017-10-03 ENCOUNTER — Ambulatory Visit: Payer: BLUE CROSS/BLUE SHIELD | Admitting: Obstetrics and Gynecology

## 2017-10-03 VITALS — BP 112/72 | HR 73 | Wt 188.0 lb

## 2017-10-03 DIAGNOSIS — Z1389 Encounter for screening for other disorder: Secondary | ICD-10-CM

## 2017-10-03 DIAGNOSIS — Z113 Encounter for screening for infections with a predominantly sexual mode of transmission: Secondary | ICD-10-CM

## 2017-10-03 DIAGNOSIS — Z01419 Encounter for gynecological examination (general) (routine) without abnormal findings: Secondary | ICD-10-CM | POA: Insufficient documentation

## 2017-10-03 DIAGNOSIS — Z975 Presence of (intrauterine) contraceptive device: Secondary | ICD-10-CM

## 2017-10-03 DIAGNOSIS — R879 Unspecified abnormal finding in specimens from female genital organs: Secondary | ICD-10-CM

## 2017-10-03 NOTE — Progress Notes (Signed)
GYNECOLOGY ANNUAL PREVENTATIVE CARE ENCOUNTER NOTE  Subjective:   Raven Tucker is a 21 y.o. G1P0 female here for a routine annual gynecologic exam.  Current complaints: odorous vaginal discharge X 3 months.   Denies abnormal vaginal bleeding, discharge, pelvic pain, problems with intercourse or other gynecologic concerns.    Gynecologic History No LMP recorded. Patient is not currently having periods (Reason: Lactating). Contraception: IUD Last Pap: 1.5 years ago. Results were: normal Last mammogram: NA  Obstetric History OB History  Gravida Para Term Preterm AB Living  1            SAB TAB Ectopic Multiple Live Births               # Outcome Date GA Lbr Len/2nd Weight Sex Delivery Anes PTL Lv  1 Gravida              Birth Comments: System Generated. Please review and update pregnancy details.      Past Medical History:  Diagnosis Date  . Allergy   . Chlamydia 2012; Dec 2013, Dec 2014   x 3  . Gonorrhea 11/12/2012, Dec 2014   x2  . 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 TONSILLECTOMY      Current Outpatient Medications on File Prior to Visit  Medication Sig Dispense Refill  . levonorgestrel (MIRENA) 20 MCG/24HR IUD 1 each by Intrauterine route once.    . valACYclovir (VALTREX) 1000 MG tablet TAKE 1 TABLET BY MOUTH EVERY DAY 30 tablet 1  . valACYclovir (VALTREX) 500 MG tablet Take 1 tablet (500 mg total) by mouth 2 (two) times daily. 60 tablet 12   No current facility-administered medications on file prior to visit.     No Known Allergies  Social History   Socioeconomic History  . Marital status: Single    Spouse name: Not on file  . Number of children: Not on file  . Years of education: Not on file  . Highest education level: Not on file  Social Needs  . Financial resource strain: Not on file  . Food insecurity - worry: Not on file  . Food insecurity - inability: Not on file  .  Transportation needs - medical: Not on file  . Transportation needs - non-medical: Not on file  Occupational History  . Not on file  Tobacco Use  . Smoking status: Never Smoker  . Smokeless tobacco: Never Used  Substance and Sexual Activity  . Alcohol use: No  . Drug use: No  . Sexual activity: Yes    Birth control/protection: None, IUD  Other Topics Concern  . Not on file  Social History Narrative  . Not on file    Family History  Problem Relation Age of Onset  . Asthma Mother   . Heart disease Paternal Grandfather     The following portions of the patient's history were reviewed and updated as appropriate: allergies, current medications, past family history, past medical history, past social history, past surgical history and problem list.  Review of Systems Pertinent items are noted in HPI.   Objective:  BP 112/72   Pulse 73   Wt 85.3 kg (188 lb)   BMI 35.52 kg/m  CONSTITUTIONAL: Well-developed, well-nourished female in no acute distress.  HENT:  Normocephalic, atraumatic, External right and left ear normal. Oropharynx is clear and moist EYES: Conjunctivae and EOM are normal. Pupils are equal, round, and reactive to light. No  scleral icterus.  NECK: Normal range of motion, supple, no masses.  Normal thyroid.  SKIN: Skin is warm and dry. No rash noted. Not diaphoretic. No erythema. No pallor. NEUROLOGIC: Alert and oriented to person, place, and time. Normal reflexes, muscle tone coordination. No cranial nerve deficit noted. PSYCHIATRIC: Normal mood and affect. Normal behavior. Normal judgment and thought content. CARDIOVASCULAR: Normal heart rate noted, regular rhythm RESPIRATORY: Clear to auscultation bilaterally. Effort and breath sounds normal, no problems with respiration noted. BREASTS: Symmetric in size. No masses, skin changes, nipple drainage, or lymphadenopathy. ABDOMEN: Soft, normal bowel sounds, no distention noted.  No tenderness, rebound or guarding.    PELVIC: Normal appearing external genitalia; normal appearing vaginal mucosa and cervix. Small amount of mucoid, pale yellow discharge noted. Normal uterine size, no other palpable masses, no uterine or adnexal tenderness. MUSCULOSKELETAL: Normal range of motion. No tenderness.  No cyanosis, clubbing, or edema.  2+ distal pulses.   Assessment and Plan:   1. Women's annual routine gynecological examination  - Pap not done today,  Patient not due for another 1.5 years   2. Abnormal vaginal fluids  - Cervicovaginal ancillary only  3. IUD (intrauterine device) in place  - Intact, no problems Placed in 2015    Will follow up results and manage accordingly. Routine preventative health maintenance measures emphasized. Please refer to After Visit Summary for other counseling recommendations.    Raven Tucker, Raven RutherfordJennifer I, NP [facility]

## 2017-10-03 NOTE — Progress Notes (Signed)
Pt had the marena  for 3 years. Also, have vaginal odor for about 2 months.

## 2017-10-04 LAB — CERVICOVAGINAL ANCILLARY ONLY
BACTERIAL VAGINITIS: POSITIVE — AB
CANDIDA VAGINITIS: NEGATIVE
CHLAMYDIA, DNA PROBE: NEGATIVE
NEISSERIA GONORRHEA: NEGATIVE
TRICH (WINDOWPATH): NEGATIVE

## 2017-10-10 ENCOUNTER — Encounter: Payer: Self-pay | Admitting: General Practice

## 2017-10-10 ENCOUNTER — Other Ambulatory Visit: Payer: Self-pay | Admitting: General Practice

## 2017-10-10 DIAGNOSIS — N760 Acute vaginitis: Principal | ICD-10-CM

## 2017-10-10 DIAGNOSIS — B9689 Other specified bacterial agents as the cause of diseases classified elsewhere: Secondary | ICD-10-CM

## 2017-10-10 MED ORDER — METRONIDAZOLE 500 MG PO TABS: 500.0000 mg | ORAL_TABLET | Freq: Two times a day (BID) | ORAL | 0 refills | Status: DC

## 2017-10-11 ENCOUNTER — Encounter: Payer: Self-pay | Admitting: Obstetrics and Gynecology

## 2018-04-24 ENCOUNTER — Other Ambulatory Visit: Payer: Self-pay

## 2018-04-24 ENCOUNTER — Encounter (HOSPITAL_COMMUNITY): Payer: Self-pay | Admitting: Emergency Medicine

## 2018-04-24 ENCOUNTER — Emergency Department (HOSPITAL_COMMUNITY)
Admission: EM | Admit: 2018-04-24 | Discharge: 2018-04-24 | Disposition: A | Discharge status: Home / Self Care | Payer: BLUE CROSS/BLUE SHIELD | Attending: Emergency Medicine | Admitting: Emergency Medicine

## 2018-04-24 DIAGNOSIS — R070 Pain in throat: Secondary | ICD-10-CM | POA: Insufficient documentation

## 2018-04-24 DIAGNOSIS — R21 Rash and other nonspecific skin eruption: Secondary | ICD-10-CM | POA: Insufficient documentation

## 2018-04-24 MED ORDER — MUPIROCIN CALCIUM 2 % EX CREA: 1.0000 "application " | TOPICAL_CREAM | Freq: Two times a day (BID) | CUTANEOUS | 0 refills | Status: AC

## 2018-04-24 MED ORDER — PERMETHRIN 5 % EX CREA: TOPICAL_CREAM | CUTANEOUS | 0 refills | Status: DC

## 2018-04-24 NOTE — ED Notes (Signed)
Pt unwitnessed leaving department. Nowhere to be found at this time.

## 2018-04-24 NOTE — ED Provider Notes (Signed)
[facility] EMERGENCY DEPARTMENT Provider Note   CSN: [id number] Arrival date & time: 04/24/18  0501     History   Chief Complaint Chief Complaint  Patient presents with  . Rash    HPI Raven Tucker is a 22 y.o. female.  HPI   22 year old female with history of eczema presenting for evaluation of a rash on her hand.  For the past 5 days she noticed an itchy and slightly burning rash to the dorsum of hand.  It started out as small little blisters which is ruptured and become more painful.  Rash is localized on the hand only.  She describes 6 out of 10 dull itchy sensation with some stiffness to her hand.  She has tried using Neosporin, cold water, and wrapped with minimal relief.  She went swimming yesterday.  She endorsed a mild sore throat.  She denies any fever or chills, she did try some new soap at work.  She was notified by the school that there were several cases of hand-foot-and-mouth going on at his underscore however her son does not have any symptoms.  She denies any recent travel.  No other environmental changes.  R right hand, index, pinky finger.  5 days, 6/10, dull, itchy, stiff.  Hx eczema, no similar rash.  Neosporin, cold water, wrap.  Went swimming yesterday.  No mouth involvement. No fever/chills. Tries new soap at work.   Skin color papule, escoriated, small,   Eczema exacerbation. Drains clear fluid. Allergic reaction? Contact dermatitis, hand/foot/mouth.   Hydrocortisone cream, mupirocin,   Past Medical History:  Diagnosis Date  . Allergy   . Chlamydia 2012; Dec 2013, Dec 2014   x 3  . Gonorrhea 11/12/2012, Dec 2014   x2  . 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 Women's annual routine gynecological examination 10/03/2017  . Abnormal vaginal fluids 10/03/2017  . Short cervix in third trimester, antepartum 12/02/2013  . Insufficient prenatal care in third trimester  11/26/2013  . Gonorrhea complicating pregnancy in second trimester 11/12/2012  . Chlamydia infection complicating pregnancy in second trimester 11/12/2012  . 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 TONSILLECTOMY       OB History    Gravida  1   Para      Term      Preterm      AB      Living        SAB      TAB      Ectopic      Multiple      Live Births               Home Medications    Prior to Admission medications   Medication Sig Start Date End Date Taking? Authorizing Provider  levonorgestrel (MIRENA) 20 MCG/24HR IUD 1 each by Intrauterine route once.    [provider]  metroNIDAZOLE (FLAGYL) 500 MG tablet Take 1 tablet (500 mg total) 2 (two) times daily by mouth. 10/10/17   Rasch, Harolyn Rutherford, NP  valACYclovir (VALTREX) 1000 MG tablet TAKE 1 TABLET BY MOUTH EVERY DAY 03/08/14   Adam Phenix, MD  valACYclovir (VALTREX) 500 MG tablet Take 1 tablet (500 mg total) by mouth 2 (two) times daily. 04/25/14   Willodean Rosenthal, MD    Family History Family History  Problem Relation Age of Onset  . Asthma Mother   . Heart disease Paternal Grandfather     Social History Social History   Tobacco Use  . Smoking status: Never Smoker  . Smokeless tobacco: Never Used  Substance Use Topics  . Alcohol use: No  . Drug use: No     Allergies   Patient has no known allergies.   Review of Systems Review of Systems  Constitutional: Negative for fever.  Skin: Positive for rash.     Physical Exam Updated Vital Signs BP 127/87 (BP Location: Right Arm)   Pulse 70   Temp 97.7 F (36.5 C) (Oral)   Resp 18   Ht  (1.549 m)   Wt 82.1 kg (181 lb)   SpO2 98%   BMI 34.20 kg/m   Physical Exam  Constitutional: She appears well-developed and well-nourished. No distress.  HENT:  Head: Atraumatic.  Eyes: Conjunctivae are normal.  Neck: Neck supple.    Neurological: She is alert.  Skin: Rash (Right hand: Multiple < 2 mm skin colored papule some of which has erupted exposing the dermis, with excoriation marks but no significant erythema and no joint involvement.  Brisk cap refill.) noted.  Psychiatric: She has a normal mood and affect.  Nursing note and vitals reviewed.    ED Treatments / Results  Labs (all labs ordered are listed, but only abnormal results are displayed) Labs Reviewed - No data to display  EKG None  Radiology No results found.  Procedures Procedures (including critical care time)  Medications Ordered in ED Medications - No data to display   Initial Impression / Assessment and Plan / ED Course  I have reviewed the triage vital signs and the nursing notes.  Pertinent labs & imaging results that were available during my care of the patient were reviewed by me and considered in my medical decision making (see chart for details).     BP 127/87 (BP Location: Right Arm)   Pulse 70   Temp 97.7 F (36.5 C) (Oral)   Resp 18   Ht  (1.549 m)   Wt 82.1 kg (181 lb)   SpO2 98%   BMI 34.20 kg/m    Final Clinical Impressions(s) / ED Diagnoses   Final diagnoses:  Rash and nonspecific skin eruption    ED Discharge Orders        Ordered    mupirocin cream (BACTROBAN) 2 %  2 times daily     04/24/18 0719    permethrin (ELIMITE) 5 % cream     04/24/18 0719     7:14 AM Patient with itching and burning rash on her pain involving the dorsum of hand without any palmar involvement.  Numerous excoriation marks noted.  Rash has potential to become infected however there are no significant skin erythema at this time.  It is near the web spacing of the finger which may increase likelihood of scabies versus dyshidrotic eczema.  No systemic involvement, low suspicion for hand-foot-and-mouth.  Plan to provide permethrin cream for potential scabies as well as mupirocin to decrease likelihood of secondary bacterial  infection.   Fayrene Helper, PA-C 04/24/18 0720    Glynn Octave, MD 04/24/18 0730

## 2018-04-24 NOTE — ED Triage Notes (Addendum)
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 No other members of household effected. Reports burning sensation.

## 2018-04-24 NOTE — ED Notes (Signed)
Pt requesting Paperwork at [facility], pt states, "It was taking too long." , this RN called the provider and he gave verbal permission to print work note, this RN printed AVS at [facility], the pts nurse came to the front desk with the pts AVS and two prescriptions, this RN called the pt at [phone] and left a message that she has prescriptions that will be located at [facility]

## 2018-11-12 ENCOUNTER — Encounter: Payer: Self-pay | Admitting: Family Medicine

## 2018-11-12 ENCOUNTER — Ambulatory Visit (INDEPENDENT_AMBULATORY_CARE_PROVIDER_SITE_OTHER): Payer: Self-pay | Admitting: Family Medicine

## 2018-11-12 VITALS — BP 130/87 | HR 76 | Ht 61.0 in | Wt 190.6 lb

## 2018-11-12 DIAGNOSIS — Z113 Encounter for screening for infections with a predominantly sexual mode of transmission: Secondary | ICD-10-CM

## 2018-11-12 DIAGNOSIS — N760 Acute vaginitis: Secondary | ICD-10-CM

## 2018-11-12 DIAGNOSIS — N898 Other specified noninflammatory disorders of vagina: Secondary | ICD-10-CM

## 2018-11-12 DIAGNOSIS — B9689 Other specified bacterial agents as the cause of diseases classified elsewhere: Secondary | ICD-10-CM

## 2018-11-12 DIAGNOSIS — Z30431 Encounter for routine checking of intrauterine contraceptive device: Secondary | ICD-10-CM

## 2018-11-12 NOTE — Progress Notes (Signed)
   Subjective:    Raven Tucker - 22 y.o. female MRN [id number]  Date of birth: December 04, 1995  HPI  Raven LomaSherrelle Ziehm is a 22 y.o. 521P1001 female here for vaginal odor and IUD check. She states that her Toney Reilmirena has been in 4 years. Recently she's started to feel some flutters in her stomach after eating that feel like the flutters she had during pregnancy. Concerned that IUD is no longer there. Also having a new odor. No itching, burning or vaginal discomfort.      OB History    Gravida  1   Para  1   Term  1   Preterm      AB      Living  1     SAB      TAB      Ectopic      Multiple      Live Births  1             Health Maintenance:   Health Maintenance Due  Topic Date Due  . PAP-Cervical Cytology Screening  12/23/2014  . PAP SMEAR-Modifier  12/23/2014  . INFLUENZA VACCINE  06/28/2018    -  reports that she has never smoked. She has never used smokeless tobacco. - Review of Systems: Per HPI. - Past Medical History: Patient Active Problem List   Diagnosis Date Noted  . Women's annual routine gynecological examination 10/03/2017  . Abnormal vaginal fluids 10/03/2017  . Short cervix in third trimester, antepartum 12/02/2013  . Insufficient prenatal care in third trimester 11/26/2013  . Gonorrhea complicating pregnancy in second trimester 11/12/2012  . Chlamydia infection complicating pregnancy in second trimester 11/12/2012  . 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  Psych: good insight, alert and oriented GU/GYN: Exam performed in the presence of a chaperone. External genitalia within  normal limits.  Vaginal mucosa pink, moist, normal rugae.  Nonfriable cervix without lesions, no discharge or bleeding noted on speculum exam.     Assessment & Plan:   1. Encounter for routine checking of intrauterine contraceptive device (IUD) - IUD strings present with length ~1cm  2. Vaginal odor - Cervicovaginal ancillary only( [place])   Routine preventative health maintenance measures emphasized. Please refer to After Visit Summary for other counseling recommendations.   No follow-ups on file.  Gwenevere AbbotNimeka Ova Meegan, MD  OB Fellow  11/12/2018, 4:43 PM

## 2018-11-13 LAB — CERVICOVAGINAL ANCILLARY ONLY
Bacterial vaginitis: POSITIVE — AB
CHLAMYDIA, DNA PROBE: NEGATIVE
Candida vaginitis: NEGATIVE
Neisseria Gonorrhea: NEGATIVE
Trichomonas: NEGATIVE

## 2018-11-13 LAB — POCT PREGNANCY, URINE: Preg Test, Ur: NEGATIVE

## 2018-11-13 MED ORDER — METRONIDAZOLE 500 MG PO TABS: 500.0000 mg | ORAL_TABLET | Freq: Two times a day (BID) | ORAL | 0 refills | Status: AC

## 2018-11-14 ENCOUNTER — Other Ambulatory Visit: Payer: Self-pay | Admitting: *Deleted

## 2018-11-14 DIAGNOSIS — B9689 Other specified bacterial agents as the cause of diseases classified elsewhere: Secondary | ICD-10-CM

## 2018-11-14 DIAGNOSIS — N760 Acute vaginitis: Principal | ICD-10-CM

## 2018-11-14 MED ORDER — METRONIDAZOLE 0.75 % VA GEL: 1.0000 | Freq: Every day | VAGINAL | 0 refills | Status: AC

## 2018-11-14 NOTE — Progress Notes (Signed)
Per patient request ordered metrogel to replace flagyl per protocol.

## 2019-08-19 ENCOUNTER — Encounter: Payer: Self-pay | Admitting: Student

## 2019-08-20 ENCOUNTER — Ambulatory Visit (INDEPENDENT_AMBULATORY_CARE_PROVIDER_SITE_OTHER): Payer: 59 | Admitting: Student

## 2019-08-20 ENCOUNTER — Ambulatory Visit: Payer: Self-pay | Admitting: Student

## 2019-08-20 ENCOUNTER — Encounter: Payer: Self-pay | Admitting: Student

## 2019-08-20 ENCOUNTER — Other Ambulatory Visit: Payer: Self-pay

## 2019-08-20 VITALS — BP 128/93 | HR 76 | Wt 191.6 lb

## 2019-08-20 DIAGNOSIS — Z30433 Encounter for removal and reinsertion of intrauterine contraceptive device: Secondary | ICD-10-CM | POA: Diagnosis not present

## 2019-08-20 LAB — POCT PREGNANCY, URINE: Preg Test, Ur: NEGATIVE

## 2019-08-20 MED ORDER — LEVONORGESTREL 19.5 MCG/DAY IU IUD
INTRAUTERINE_SYSTEM | Freq: Once | INTRAUTERINE | Status: AC
  Administered 2019-08-20: 16:00:00 via INTRAUTERINE

## 2019-08-20 NOTE — Patient Instructions (Signed)
Intrauterine Device Insertion, Care After  This sheet gives you information about how to care for yourself after your procedure. Your health care provider may also give you more specific instructions. If you have problems or questions, contact your health care provider. What can I expect after the procedure? After the procedure, it is common to have:  Cramps and pain in the abdomen.  Light bleeding (spotting) or heavier bleeding that is like your menstrual period. This may last for up to a few days.  Lower back pain.  Dizziness.  Headaches.  Nausea. Follow these instructions at home:  Before resuming sexual activity, check to make sure that you can feel the IUD string(s). You should be able to feel the end of the string(s) below the opening of your cervix. If your IUD string is in place, you may resume sexual activity. ? If you had a hormonal IUD inserted more than 7 days after your most recent period started, you will need to use a backup method of birth control for 7 days after IUD insertion. Ask your health care provider whether this applies to you.  Continue to check that the IUD is still in place by feeling for the string(s) after every menstrual period, or once a month.  Take over-the-counter and prescription medicines only as told by your health care provider.  Do not drive or use heavy machinery while taking prescription pain medicine.  Keep all follow-up visits as told by your health care provider. This is important. Contact a health care provider if:  You have bleeding that is heavier or lasts longer than a normal menstrual cycle.  You have a fever.  You have cramps or abdominal pain that get worse or do not get better with medicine.  You develop abdominal pain that is new or is not in the same area of earlier cramping and pain.  You feel lightheaded or weak.  You have abnormal or bad-smelling discharge from your vagina.  You have pain during sexual activity.   You have any of the following problems with your IUD string(s): ? The string bothers or hurts you or your sexual partner. ? You cannot feel the string. ? The string has gotten longer.  You can feel the IUD in your vagina.  You think you may be pregnant, or you miss your menstrual period.  You think you may have an STI (sexually transmitted infection). Get help right away if:  You have flu-like symptoms.  You have a fever and chills.  You can feel that your IUD has slipped out of place. Summary  After the procedure, it is common to have cramps and pain in the abdomen. It is also common to have light bleeding (spotting) or heavier bleeding that is like your menstrual period.  Continue to check that the IUD is still in place by feeling for the string(s) after every menstrual period, or once a month.  Keep all follow-up visits as told by your health care provider. This is important.  Contact your health care provider if you have problems with your IUD string(s), such as the string getting longer or bothering you or your sexual partner. This information is not intended to replace advice given to you by your health care provider. Make sure you discuss any questions you have with your health care provider. Document Released: 07/13/2011 Document Revised: 10/27/2017 Document Reviewed: 10/05/2016 Elsevier Patient Education  2020 [employer]  

## 2019-08-20 NOTE — Progress Notes (Signed)
      GYNECOLOGY CLINIC PROCEDURE NOTE  Dolora Ridgely is a 23 y.o. G1P1001 here for Mirena IUD removal and Liletta IUD insertion. No GYN concerns.  Last pap smear was on last month at [facility] and was normal.  IUD Removal  Patient was in the dorsal lithotomy position, normal external genitalia was noted.  A speculum was placed in the patient's vagina, normal discharge was noted, no lesions. The multiparous cervix was visualized, no lesions, no abnormal discharge.  The strings of the IUD were grasped and pulled using ring forceps. The IUD was removed in its entirety.  Patient tolerated the procedure well.     IUD Insertion Procedure Note Patient identified, informed consent performed, consent signed.   Discussed risks of irregular bleeding, cramping, infection, malpositioning or misplacement of the IUD outside the uterus which may require further procedure such as laparoscopy. Time out was performed.  Urine pregnancy test negative.  Speculum placed in the vagina.  Cervix visualized.  Cleaned with Betadine x 2.  Grasped anteriorly with a single tooth tenaculum.  Uterus sounded to 6 cm.  Liletta IUD placed per manufacturer's recommendations.  Strings trimmed to 3 cm. Tenaculum was removed, good hemostasis noted.  Patient tolerated procedure well.   Patient was given post-procedure instructions.  She was advised to have backup contraception for one week.  Patient was also asked to check IUD strings periodically and follow up in 4 weeks for IUD check.  Jorje Guild, NP

## 2019-09-17 ENCOUNTER — Ambulatory Visit: Payer: 59 | Admitting: Student

## 2019-09-27 ENCOUNTER — Encounter: Payer: Self-pay | Admitting: *Deleted

## 2019-11-05 ENCOUNTER — Ambulatory Visit: Payer: 59 | Admitting: Student

## 2021-07-27 ENCOUNTER — Other Ambulatory Visit: Payer: Self-pay

## 2021-07-27 ENCOUNTER — Emergency Department (HOSPITAL_COMMUNITY): Payer: BC Managed Care – PPO

## 2021-07-27 ENCOUNTER — Emergency Department (HOSPITAL_COMMUNITY)
Admission: EM | Admit: 2021-07-27 | Discharge: 2021-07-27 | Disposition: A | Discharge status: Home / Self Care | Payer: BC Managed Care – PPO | Attending: Student | Admitting: Student

## 2021-07-27 ENCOUNTER — Encounter (HOSPITAL_COMMUNITY): Payer: Self-pay | Admitting: Emergency Medicine

## 2021-07-27 DIAGNOSIS — S99911A Unspecified injury of right ankle, initial encounter: Secondary | ICD-10-CM | POA: Diagnosis present

## 2021-07-27 DIAGNOSIS — X58XXXA Exposure to other specified factors, initial encounter: Secondary | ICD-10-CM | POA: Diagnosis not present

## 2021-07-27 NOTE — ED Triage Notes (Signed)
Patient states she was exercising two weeks ago and hurt right angle. Patient states that she wrapped and keep it elevated but now that is not working.

## 2021-07-27 NOTE — Discharge Instructions (Addendum)
Wear walking boot for 1 week and then as needed for pain. Rest, ice, compress, and elevate ankle as much as possible. Take Tylenol and Ibuprofen for pain management. Follow-up with ortho if pain persists longer than 1 week.

## 2021-07-27 NOTE — ED Notes (Signed)
Pt reports nontraumatic right ankle pain after wearing wrong shoes to workout.

## 2021-07-27 NOTE — ED Provider Notes (Signed)
[facility]-EMERGENCY DEPT Provider Note   CSN: [id number] Arrival date & time: 07/27/21  1914     History Chief Complaint  Patient presents with   Ankle Injury    Raven Tucker is a 25 y.o. female.   Raven Tucker is a 25 y.o. female who complains of overuse injury to the right ankle 2 week(s) ago. Patient states that injury occurred when she wore the wrong shoes to her exercise class. Immediate symptoms: delayed pain, was able to bear weight directly after injury. Patient states that pain was minor until a week ago when she had to be on her feet more than usual which resulted in worsening pain.  There is pain noted over 5th metatarsal and lateral malleolus. No swelling present. Patient is ambulatory, no interventions tried at this time.   The history is provided by the patient. No language interpreter was used.  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   Past Surgical History:  Procedure Laterality Date   TONSILLECTOMY       OB History     Gravida  1   Para  1   Term  1   Preterm      AB      Living  1      SAB      IAB      Ectopic      Multiple      Live Births  1           Family History  Problem Relation Age of Onset   Asthma Mother    Heart disease Paternal Grandfather     Social History   Tobacco Use   Smoking status: Never   Smokeless tobacco: Never  Vaping Use   Vaping Use: Never used  Substance Use Topics   Alcohol use: No   Drug use: No    Home Medications Prior to Admission medications   Medication Sig Start Date End Date Taking? Authorizing Provider  levonorgestrel (MIRENA) 20 MCG/24HR IUD 1 each by Intrauterine route once.    [provider]  metroNIDAZOLE (FLAGYL) 500 MG tablet Take 1 tablet (500 mg total) by mouth 2 (two) times daily. Patient not taking: Reported on 08/20/2019 11/13/18   Gwenevere Abbot, MD  valACYclovir (VALTREX) 500 MG tablet Take 1 tablet (500 mg total) by mouth 2 (two) times daily. Patient not taking: Reported on 08/20/2019 04/25/14   Raven Rosenthal, MD    Allergies    Patient has no known allergies.  Review of Systems   Review of Systems  Constitutional:  Negative for chills and fever.  Musculoskeletal:  Negative for arthralgias, gait problem and joint swelling.  Skin:  Negative for wound.   Physical Exam Updated Vital Signs BP 117/81 (BP Location: Right Arm)   Pulse 78   Temp 99.1 F (37.3 C) (Oral)   Resp 16   Ht 5\' 1"  (1.549 m)   Wt 79.4 kg   SpO2 100%   BMI 33.07 kg/m   Physical Exam Cardiovascular:     Pulses:          Dorsalis pedis pulses are 2+ on the right  side.       Posterior tibial pulses are 2+ on the right side.  Musculoskeletal:     Right foot: Decreased range of motion. Normal capillary refill. Tenderness and bony tenderness present. No deformity, bunion, Charcot foot, foot drop, prominent metatarsal heads or crepitus. Normal pulse.     Comments: Tenderness noted to 5th metatarsal head and anterior lateral malleolus of right foot. Pain noted with dorsiflexion and inversion of same. No swelling noted.  Feet:     Right foot:     Skin integrity: Skin integrity normal. No ulcer, blister, skin breakdown, erythema, warmth, callus, dry skin or fissure.    ED Results / Procedures / Treatments   Labs (all labs ordered are listed, but only abnormal results are displayed) Labs Reviewed - No data to display  EKG None  Radiology DG Ankle Complete Right  Result Date: 07/27/2021 CLINICAL DATA:  Trauma to the right ankle. EXAM: RIGHT ANKLE - COMPLETE 3+ VIEW COMPARISON:  None. FINDINGS: There is no evidence of fracture, dislocation, or joint effusion. There is no  evidence of arthropathy or other focal bone abnormality. Soft tissues are unremarkable. IMPRESSION: Negative. Electronically Signed   By: Elgie Collard M.D.   On: 07/27/2021 20:07    Procedures Procedures   Medications Ordered in ED Medications - No data to display  ED Course  I have reviewed the triage vital signs and the nursing notes.  Pertinent labs & imaging results that were available during my care of the patient were reviewed by me and considered in my medical decision making (see chart for details).    MDM Rules/Calculators/A&P                         Raven Tucker is a 25 y.o. female who presents to ED for right ankle pain after wearing wrong shoes followed by overuse. RLE NVI. Exam c/w overuse injury. X-ray negative for acute injury. Walking boot provided in ED. Home care instructions including RICE and NSAID's discussed. Follow up with ortho if symptoms not improving in 1 week. All questions answered.     Final Clinical Impression(s) / ED Diagnoses Final diagnoses:  Injury of right ankle, initial encounter    Rx / DC Orders ED Discharge Orders     None     An After Visit Summary was printed and given to the patient.    Vear Clock 07/27/21 2322    Glendora Score, MD 07/28/21 [phone]

## 2023-03-21 ENCOUNTER — Other Ambulatory Visit: Payer: Self-pay | Admitting: Nurse Practitioner

## 2023-03-21 ENCOUNTER — Ambulatory Visit: Payer: Self-pay

## 2023-03-21 DIAGNOSIS — S93401A Sprain of unspecified ligament of right ankle, initial encounter: Secondary | ICD-10-CM
